# Patient Record
Sex: Female | Born: 1980 | Race: Black or African American | Hispanic: No | Marital: Single | Smoking: Never smoker
Health system: Southern US, Community
[De-identification: ages and names within clinical notes are randomized; demographics above are authoritative.]

---

## 2008-09-05 ENCOUNTER — Emergency Department (HOSPITAL_COMMUNITY): Admission: EM | Admit: 2008-09-05 | Discharge: 2008-09-05 | Payer: Self-pay | Admitting: Emergency Medicine

## 2008-09-07 ENCOUNTER — Emergency Department (HOSPITAL_COMMUNITY): Admission: EM | Admit: 2008-09-07 | Discharge: 2008-09-07 | Payer: Self-pay | Admitting: Family Medicine

## 2008-10-12 ENCOUNTER — Emergency Department (HOSPITAL_COMMUNITY): Admission: EM | Admit: 2008-10-12 | Discharge: 2008-10-12 | Payer: Self-pay | Admitting: Emergency Medicine

## 2010-03-15 ENCOUNTER — Emergency Department (HOSPITAL_BASED_OUTPATIENT_CLINIC_OR_DEPARTMENT_OTHER)
Admission: EM | Admit: 2010-03-15 | Discharge: 2010-03-15 | Payer: Self-pay | Source: Home / Self Care | Admitting: Emergency Medicine

## 2010-07-15 LAB — URINALYSIS, ROUTINE W REFLEX MICROSCOPIC
Ketones, ur: 15 mg/dL — AB
Nitrite: NEGATIVE
Protein, ur: NEGATIVE mg/dL
Urobilinogen, UA: 1 mg/dL (ref 0.0–1.0)
pH: 6 (ref 5.0–8.0)

## 2010-07-15 LAB — RPR: RPR Ser Ql: NONREACTIVE

## 2010-07-15 LAB — WET PREP, GENITAL

## 2010-07-15 LAB — PREGNANCY, URINE: Preg Test, Ur: NEGATIVE

## 2010-08-12 LAB — POCT RAPID STREP A (OFFICE): Streptococcus, Group A Screen (Direct): POSITIVE — AB

## 2011-10-18 ENCOUNTER — Encounter (HOSPITAL_BASED_OUTPATIENT_CLINIC_OR_DEPARTMENT_OTHER): Payer: Self-pay | Admitting: *Deleted

## 2011-10-18 ENCOUNTER — Emergency Department (HOSPITAL_BASED_OUTPATIENT_CLINIC_OR_DEPARTMENT_OTHER)
Admission: EM | Admit: 2011-10-18 | Discharge: 2011-10-18 | Disposition: A | Discharge status: Home / Self Care | Payer: No Typology Code available for payment source | Attending: Emergency Medicine | Admitting: Emergency Medicine

## 2011-10-18 DIAGNOSIS — R51 Headache: Secondary | ICD-10-CM | POA: Insufficient documentation

## 2011-10-18 DIAGNOSIS — M546 Pain in thoracic spine: Secondary | ICD-10-CM | POA: Insufficient documentation

## 2011-10-18 DIAGNOSIS — S0990XA Unspecified injury of head, initial encounter: Secondary | ICD-10-CM

## 2011-10-18 DIAGNOSIS — M542 Cervicalgia: Secondary | ICD-10-CM | POA: Insufficient documentation

## 2011-10-18 DIAGNOSIS — Y92410 Unspecified street and highway as the place of occurrence of the external cause: Secondary | ICD-10-CM | POA: Insufficient documentation

## 2011-10-18 DIAGNOSIS — S161XXA Strain of muscle, fascia and tendon at neck level, initial encounter: Secondary | ICD-10-CM

## 2011-10-18 MED ORDER — TRAMADOL HCL 50 MG PO TABS: 50.0000 mg | ORAL_TABLET | Freq: Four times a day (QID) | ORAL | Status: DC | PRN

## 2011-10-18 MED ORDER — METHOCARBAMOL 500 MG PO TABS: 500.0000 mg | ORAL_TABLET | Freq: Two times a day (BID) | ORAL | Status: DC

## 2011-10-18 MED ORDER — IBUPROFEN 800 MG PO TABS: 800.0000 mg | ORAL_TABLET | Freq: Three times a day (TID) | ORAL | Status: DC
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
## 2011-10-20 ENCOUNTER — Encounter (HOSPITAL_BASED_OUTPATIENT_CLINIC_OR_DEPARTMENT_OTHER): Payer: Self-pay | Admitting: *Deleted

## 2011-10-20 ENCOUNTER — Emergency Department (HOSPITAL_BASED_OUTPATIENT_CLINIC_OR_DEPARTMENT_OTHER): Payer: No Typology Code available for payment source

## 2011-10-20 ENCOUNTER — Emergency Department (HOSPITAL_BASED_OUTPATIENT_CLINIC_OR_DEPARTMENT_OTHER)
Admission: EM | Admit: 2011-10-20 | Discharge: 2011-10-20 | Disposition: A | Discharge status: Home / Self Care | Payer: No Typology Code available for payment source | Attending: Emergency Medicine | Admitting: Emergency Medicine

## 2011-10-20 ENCOUNTER — Other Ambulatory Visit: Payer: Self-pay | Admitting: Specialist

## 2011-10-20 DIAGNOSIS — R51 Headache: Secondary | ICD-10-CM

## 2011-10-20 MED ORDER — KETOROLAC TROMETHAMINE 60 MG/2ML IM SOLN
60.0000 mg | Freq: Once | INTRAMUSCULAR | Status: AC
  Administered 2011-10-20: 60 mg via INTRAMUSCULAR
  Filled 2011-10-20: qty 2

## 2011-10-20 MED ORDER — HYDROCODONE-ACETAMINOPHEN 5-500 MG PO TABS: 1.0000 | ORAL_TABLET | Freq: Four times a day (QID) | ORAL | Status: AC | PRN
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
## 2011-10-20 NOTE — ED Provider Notes (Signed)
Medical screening examination/treatment/procedure(s) were performed by non-physician practitioner and as supervising physician I was immediately available for consultation/collaboration.    Tevyn Codd L Laurajean Hosek, MD 10/20/11 0708 

## 2011-10-20 NOTE — Discharge Instructions (Signed)

## 2011-10-22 ENCOUNTER — Other Ambulatory Visit: Payer: No Typology Code available for payment source

## 2012-12-10 ENCOUNTER — Emergency Department (HOSPITAL_BASED_OUTPATIENT_CLINIC_OR_DEPARTMENT_OTHER)
Admission: EM | Admit: 2012-12-10 | Discharge: 2012-12-10 | Disposition: A | Discharge status: Home / Self Care | Payer: Medicaid Other | Attending: Emergency Medicine | Admitting: Emergency Medicine

## 2012-12-10 ENCOUNTER — Encounter (HOSPITAL_BASED_OUTPATIENT_CLINIC_OR_DEPARTMENT_OTHER): Payer: Self-pay | Admitting: Emergency Medicine

## 2012-12-10 DIAGNOSIS — R197 Diarrhea, unspecified: Secondary | ICD-10-CM | POA: Insufficient documentation

## 2012-12-10 DIAGNOSIS — N390 Urinary tract infection, site not specified: Secondary | ICD-10-CM | POA: Insufficient documentation

## 2012-12-10 DIAGNOSIS — Z3202 Encounter for pregnancy test, result negative: Secondary | ICD-10-CM | POA: Insufficient documentation

## 2012-12-10 DIAGNOSIS — R110 Nausea: Secondary | ICD-10-CM | POA: Insufficient documentation

## 2012-12-10 DIAGNOSIS — R109 Unspecified abdominal pain: Secondary | ICD-10-CM | POA: Insufficient documentation

## 2012-12-10 LAB — URINALYSIS, ROUTINE W REFLEX MICROSCOPIC
Nitrite: NEGATIVE
Specific Gravity, Urine: 1.026 (ref 1.005–1.030)
Urobilinogen, UA: 0.2 mg/dL (ref 0.0–1.0)

## 2012-12-10 LAB — URINE MICROSCOPIC-ADD ON

## 2012-12-10 LAB — PREGNANCY, URINE: Preg Test, Ur: NEGATIVE

## 2012-12-10 MED ORDER — ONDANSETRON 8 MG PO TBDP: ORAL_TABLET | ORAL | Status: DC

## 2012-12-10 MED ORDER — ONDANSETRON 4 MG PO TBDP
4.0000 mg | ORAL_TABLET | Freq: Once | ORAL | Status: AC
  Administered 2012-12-10: 4 mg via ORAL
  Filled 2012-12-10: qty 1

## 2012-12-10 MED ORDER — HYDROCODONE-ACETAMINOPHEN 5-325 MG PO TABS: 1.0000 | ORAL_TABLET | Freq: Four times a day (QID) | ORAL | Status: DC | PRN

## 2012-12-10 MED ORDER — NITROFURANTOIN MONOHYD MACRO 100 MG PO CAPS: 100.0000 mg | ORAL_CAPSULE | Freq: Two times a day (BID) | ORAL | Status: DC

## 2012-12-10 NOTE — ED Notes (Signed)
Grape juice  Given as PO challenge.

## 2012-12-10 NOTE — ED Provider Notes (Signed)
  CSN: [id number]     Arrival date & time 12/10/12  0046 History     First MD Initiated Contact with Patient 12/10/12 0203     Chief Complaint  Patient presents with  . Abdominal Pain  . Diarrhea   (Consider location/radiation/quality/duration/timing/severity/associated sxs/prior Treatment) Patient is a 32 y.o. female presenting with diarrhea. The history is provided by the patient. No language interpreter was used.  Diarrhea Quality:  Watery Severity:  Moderate Onset quality:  Sudden Timing:  Intermittent Progression:  Unchanged Relieved by:  Nothing Worsened by:  Nothing tried Ineffective treatments:  None tried Associated symptoms: no diaphoresis, no fever and no vomiting   Risk factors: suspect food intake     History reviewed. No pertinent past medical history. History reviewed. No pertinent past surgical history. No family history on file. History  Substance Use Topics  . Smoking status: Never Smoker   . Smokeless tobacco: Not on file  . Alcohol Use: No   OB History   Grav Para Term Preterm Abortions TAB SAB Ect Mult Living                 Review of Systems  Constitutional: Negative for fever and diaphoresis.  Gastrointestinal: Positive for nausea and diarrhea. Negative for vomiting.  All other systems reviewed and are negative.    Allergies  Review of patient's allergies indicates no known allergies.  Home Medications  No current outpatient prescriptions on file. BP 122/82  Pulse 90  Temp(Src) 99.2 F (37.3 C) (Oral)  Resp 16  Ht 5\' 8"  (1.727 m)  Wt 150 lb (68.04 kg)  BMI 22.81 kg/m2  SpO2 100%  LMP 12/04/2012 Physical Exam  Constitutional: She is oriented to person, place, and time. She appears well-developed and well-nourished. No distress.  HENT:  Head: Normocephalic and atraumatic.  Mouth/Throat: Oropharynx is clear and moist.  Eyes: Conjunctivae are normal. Pupils are equal, round, and reactive to light.  Neck: Normal range of motion. Neck  supple.  Cardiovascular: Normal rate, regular rhythm and intact distal pulses.   Pulmonary/Chest: Effort normal and breath sounds normal. She has no wheezes. She has no rales.  Abdominal: Soft. Bowel sounds are normal. There is no tenderness. There is no rebound and no guarding.  Musculoskeletal: Normal range of motion.  Neurological: She is alert and oriented to person, place, and time.  Skin: Skin is warm and dry.  Psychiatric: She has a normal mood and affect.    ED Course   Procedures (including critical care time)  Labs Reviewed  URINALYSIS, ROUTINE W REFLEX MICROSCOPIC - Abnormal; Notable for the following:    APPearance TURBID (*)    Leukocytes, UA MODERATE (*)    All other components within normal limits  URINE MICROSCOPIC-ADD ON - Abnormal; Notable for the following:    Squamous Epithelial / LPF FEW (*)    Bacteria, UA MANY (*)    All other components within normal limits  URINE CULTURE  PREGNANCY, URINE   No results found. No diagnosis found.  MDM  No vomiting 12 hours of diarrhea and cramping with BM.  No travel no antibiotics.  Will given anti emetics and bland diet.  Return for pain.  Intractable diarrhea or vomiting.  No indication for imaging at this time.  Will treat for UTI.    Jasmine Awe, MD 12/10/12 [id number]

## 2012-12-10 NOTE — ED Notes (Signed)
Watery diarrhea and sharp abd pain since yesterday.  Diarrhea immediately after eating or drinking. Denies vomiting.

## 2012-12-13 LAB — URINE CULTURE

## 2012-12-14 NOTE — ED Notes (Signed)
+   Urine Culture Patient treated per protocol MD.  

## 2013-04-06 ENCOUNTER — Encounter (HOSPITAL_BASED_OUTPATIENT_CLINIC_OR_DEPARTMENT_OTHER): Payer: Self-pay | Admitting: Emergency Medicine

## 2013-04-06 ENCOUNTER — Emergency Department (HOSPITAL_BASED_OUTPATIENT_CLINIC_OR_DEPARTMENT_OTHER)
Admission: EM | Admit: 2013-04-06 | Discharge: 2013-04-06 | Disposition: A | Discharge status: Home / Self Care | Payer: Medicaid Other | Attending: Emergency Medicine | Admitting: Emergency Medicine

## 2013-04-06 DIAGNOSIS — N390 Urinary tract infection, site not specified: Secondary | ICD-10-CM | POA: Insufficient documentation

## 2013-04-06 DIAGNOSIS — Z3202 Encounter for pregnancy test, result negative: Secondary | ICD-10-CM | POA: Insufficient documentation

## 2013-04-06 LAB — URINE MICROSCOPIC-ADD ON

## 2013-04-06 LAB — URINALYSIS, ROUTINE W REFLEX MICROSCOPIC
Bilirubin Urine: NEGATIVE
Ketones, ur: NEGATIVE mg/dL
Nitrite: NEGATIVE
Urobilinogen, UA: 1 mg/dL (ref 0.0–1.0)
pH: 8.5 — ABNORMAL HIGH (ref 5.0–8.0)

## 2013-04-06 MED ORDER — NITROFURANTOIN MONOHYD MACRO 100 MG PO CAPS: 100.0000 mg | ORAL_CAPSULE | Freq: Two times a day (BID) | ORAL | Status: DC

## 2013-04-06 NOTE — ED Notes (Signed)
Painful urination   Also now seeing some blood on tissue

## 2013-04-06 NOTE — ED Provider Notes (Signed)
TIME SEEN: 3:35 PM  CHIEF COMPLAINT: Hematuria, dysuria  HPI: Patient is a 32 year old G3 P2 A1 who presents emergency department with foul-smelling urine that started yesterday and dysuria, urinary frequency and hematuria that started today. She states this feels similar to her prior urinary tract infections. She's not sexually active. Her last period was 2 weeks ago. She denies any nausea, vomiting or diarrhea. No back pain. No fevers or chills. No vaginal bleeding or discharge.  ROS: See HPI Constitutional: no fever  Eyes: no drainage  ENT: no runny nose   Cardiovascular:  no chest pain  Resp: no SOB  GI: no vomiting GU: dysuria Integumentary: no rash  Allergy: no hives  Musculoskeletal: no leg swelling  Neurological: no slurred speech ROS otherwise negative  PAST MEDICAL HISTORY/PAST SURGICAL HISTORY:  History reviewed. No pertinent past medical history.  MEDICATIONS:  Prior to Admission medications   Medication Sig Start Date End Date Taking? Authorizing Provider  HYDROcodone-acetaminophen (NORCO) 5-325 MG per tablet Take 1 tablet by mouth every 6 (six) hours as needed for pain. 12/10/12   April K Palumbo-Rasch, MD  nitrofurantoin, macrocrystal-monohydrate, (MACROBID) 100 MG capsule Take 1 capsule (100 mg total) by mouth 2 (two) times daily. 12/10/12   April K Palumbo-Rasch, MD  ondansetron [facility] ODT) 8 MG disintegrating tablet 8mg  ODT q8 hours prn nausea 12/10/12   April Smitty Cords, MD    ALLERGIES:  No Known Allergies  SOCIAL HISTORY:  History  Substance Use Topics  . Smoking status: Never Smoker   . Smokeless tobacco: Not on file  . Alcohol Use: No    FAMILY HISTORY: No family history on file.  EXAM: BP 108/68  Pulse 79  Temp(Src) 98.4 F (36.9 C) (Oral)  Resp 20  Ht 5\' 8"  (1.727 m)  Wt 186 lb (84.369 kg)  BMI 28.29 kg/m2  SpO2 100% CONSTITUTIONAL: Alert and oriented and responds appropriately to questions. Well-appearing; well-nourished HEAD:  Normocephalic EYES: Conjunctivae clear, PERRL ENT: normal nose; no rhinorrhea; moist mucous membranes; pharynx without lesions noted NECK: Supple, no meningismus, no LAD  CARD: RRR; S1 and S2 appreciated; no murmurs, no clicks, no rubs, no gallops RESP: Normal chest excursion without splinting or tachypnea; breath sounds clear and equal bilaterally; no wheezes, no rhonchi, no rales,  ABD/GI: Normal bowel sounds; non-distended; soft, non-tender, no rebound, no guarding BACK:  The back appears normal and is non-tender to palpation, there is no CVA tenderness EXT: Normal ROM in all joints; non-tender to palpation; no edema; normal capillary refill; no cyanosis    SKIN: Normal color for age and race; warm NEURO: Moves all extremities equally PSYCH: The patient's mood and manner are appropriate. Grooming and personal hygiene are appropriate.  MEDICAL DECISION MAKING: Patient here with symptoms similar to her prior UTI. Urinalysis shows large hemoglobin and large leukocytes with many bacteria. Will treat for UTI. Culture pending. Pregnancy test is negative. Prior urine culture in August 2014 shows Escherichia coli that is pansensitive to all antibiotics other than ampicillin. We'll discharge home on Macrobid. Given return precautions and PCP followup.       Layla Maw Ward, DO 04/06/13 1553

## 2013-04-08 LAB — URINE CULTURE

## 2013-04-09 ENCOUNTER — Telehealth (HOSPITAL_COMMUNITY): Payer: Self-pay | Admitting: Emergency Medicine

## 2013-04-09 NOTE — ED Notes (Signed)
Post ED Visit - Positive Culture Follow-up  Culture report reviewed by antimicrobial stewardship pharmacist: []  Wes Dulaney, Pharm.D., BCPS []  Celedonio Miyamoto, Pharm.D., BCPS [x]  Georgina Pillion, Pharm.D., BCPS []  [place], [place].D., BCPS, AAHIVP []  Estella Husk, Pharm.D., BCPS, AAHIVP  Positive urine culture Treated with Macrobid, organism sensitive to the same and no further patient follow-up is required at this time.  Kylie A Holland 04/09/2013, 11:01 AM

## 2014-08-26 ENCOUNTER — Emergency Department (HOSPITAL_COMMUNITY)
Admission: EM | Admit: 2014-08-26 | Discharge: 2014-08-26 | Disposition: A | Discharge status: Home / Self Care | Payer: 59 | Attending: Emergency Medicine | Admitting: Emergency Medicine

## 2014-08-26 DIAGNOSIS — Z79899 Other long term (current) drug therapy: Secondary | ICD-10-CM | POA: Insufficient documentation

## 2014-08-26 DIAGNOSIS — Y9289 Other specified places as the place of occurrence of the external cause: Secondary | ICD-10-CM | POA: Insufficient documentation

## 2014-08-26 DIAGNOSIS — Z3202 Encounter for pregnancy test, result negative: Secondary | ICD-10-CM | POA: Insufficient documentation

## 2014-08-26 DIAGNOSIS — Y998 Other external cause status: Secondary | ICD-10-CM | POA: Insufficient documentation

## 2014-08-26 DIAGNOSIS — R300 Dysuria: Secondary | ICD-10-CM | POA: Insufficient documentation

## 2014-08-26 DIAGNOSIS — S60511A Abrasion of right hand, initial encounter: Secondary | ICD-10-CM | POA: Diagnosis not present

## 2014-08-26 DIAGNOSIS — S6991XA Unspecified injury of right wrist, hand and finger(s), initial encounter: Secondary | ICD-10-CM | POA: Diagnosis present

## 2014-08-26 DIAGNOSIS — Z23 Encounter for immunization: Secondary | ICD-10-CM | POA: Diagnosis not present

## 2014-08-26 DIAGNOSIS — Y9389 Activity, other specified: Secondary | ICD-10-CM | POA: Diagnosis not present

## 2014-08-26 DIAGNOSIS — W458XXA Other foreign body or object entering through skin, initial encounter: Secondary | ICD-10-CM | POA: Insufficient documentation

## 2014-08-26 LAB — URINALYSIS, ROUTINE W REFLEX MICROSCOPIC
Bilirubin Urine: NEGATIVE
GLUCOSE, UA: NEGATIVE mg/dL
HGB URINE DIPSTICK: NEGATIVE
KETONES UR: NEGATIVE mg/dL
Nitrite: NEGATIVE
Protein, ur: NEGATIVE mg/dL
SPECIFIC GRAVITY, URINE: 1.024 (ref 1.005–1.030)
UROBILINOGEN UA: 0.2 mg/dL (ref 0.0–1.0)
pH: 6 (ref 5.0–8.0)

## 2014-08-26 LAB — URINE MICROSCOPIC-ADD ON

## 2014-08-26 LAB — POC URINE PREG, ED: Preg Test, Ur: NEGATIVE

## 2014-08-26 MED ORDER — TETANUS-DIPHTH-ACELL PERTUSSIS 5-2.5-18.5 LF-MCG/0.5 IM SUSP
0.5000 mL | Freq: Once | INTRAMUSCULAR | Status: AC
  Administered 2014-08-26: 0.5 mL via INTRAMUSCULAR
  Filled 2014-08-26: qty 0.5

## 2014-08-26 NOTE — Discharge Instructions (Signed)
Abrasion An abrasion is a cut or scrape of the skin. Abrasions do not extend through all layers of the skin and most heal within 10 days. It is important to care for your abrasion properly to prevent infection. CAUSES  Most abrasions are caused by falling on, or gliding across, the ground or other surface. When your skin rubs on something, the outer and inner layer of skin rubs off, causing an abrasion. DIAGNOSIS  Your caregiver will be able to diagnose an abrasion during a physical exam.  TREATMENT  Your treatment depends on how large and deep the abrasion is. Generally, your abrasion will be cleaned with water and a mild soap to remove any dirt or debris. An antibiotic ointment may be put over the abrasion to prevent an infection. A bandage (dressing) may be wrapped around the abrasion to keep it from getting dirty.  You may need a tetanus shot if:  You cannot remember when you had your last tetanus shot.  You have never had a tetanus shot.  The injury broke your skin. If you get a tetanus shot, your arm may swell, get red, and feel warm to the touch. This is common and not a problem. If you need a tetanus shot and you choose not to have one, there is a rare chance of getting tetanus. Sickness from tetanus can be serious.  HOME CARE INSTRUCTIONS   If a dressing was applied, change it at least once a day or as directed by your caregiver. If the bandage sticks, soak it off with warm water.   Wash the area with water and a mild soap to remove all the ointment 2 times a day. Rinse off the soap and pat the area dry with a clean towel.   Reapply any ointment as directed by your caregiver. This will help prevent infection and keep the bandage from sticking. Use gauze over the wound and under the dressing to help keep the bandage from sticking.   Change your dressing right away if it becomes wet or dirty.   Only take over-the-counter or prescription medicines for pain, discomfort, or fever as  directed by your caregiver.   Follow up with your caregiver within 24-48 hours for a wound check, or as directed. If you were not given a wound-check appointment, look closely at your abrasion for redness, swelling, or pus. These are signs of infection. SEEK IMMEDIATE MEDICAL CARE IF:   You have increasing pain in the wound.   You have redness, swelling, or tenderness around the wound.   You have pus coming from the wound.   You have a fever or persistent symptoms for more than 2-3 days.  You have a fever and your symptoms suddenly get worse.  You have a bad smell coming from the wound or dressing.  MAKE SURE YOU:   Understand these instructions.  Will watch your condition.  Will get help right away if you are not doing well or get worse. Document Released: 01/28/2005 Document Revised: 04/06/2012 Document Reviewed: 03/24/2011 [facility] Patient Information 2015 [place], [place]. This information is not intended to replace advice given to you by your health care provider. Make sure you discuss any questions you have with your health care provider.    Diphtheria Toxoid; Tetanus Toxoid Adsorbed, DT, Td What is this medicine? DIPHTHERIA AND TETANUS TOXOIDS ADSORBED (dif THEER ee uh and TET n [place] TOK soids ad SAWRB) is a vaccine. It is used to prevent infections of diphtheria and tetanus (lockjaw). This  medicine may be used for other purposes; ask your health care provider or pharmacist if you have questions. COMMON BRAND NAME(S): DECAVAC, TENIVAC What should I tell my health care provider before I take this medicine? They need to know if you have any of these conditions: -bleeding disorder -immune system problems -infection with fever -low levels of platelets in the blood -an unusual or allergic reaction to diphtheria or tetanus toxoid, latex, thimerosal, other medicines, foods, dyes, or preservatives -pregnant or trying to get pregnant -breast-feeding How should I use this  medicine? This vaccine is for injection into a muscle. It is given by a health care professional. A copy of Vaccine Information Statements will be given before each vaccination. Read this sheet carefully each time. The sheet may change frequently. Talk to your pediatrician regarding the use of this medicine in children. While this drug may be prescribed for selected conditions, precautions do apply. Overdosage: If you think you have taken too much of this medicine contact a poison control center or emergency room at once. NOTE: This medicine is only for you. Do not share this medicine with others. What if I miss a dose? Keep appointments for follow-up (booster) doses as directed. It is important not to miss your dose. Call your doctor or health care professional if you are unable to keep an appointment. What may interact with this medicine? -adalimumab -anakinra -infliximab -live vaccines -medicines that suppress your immune system -medicines to treat cancer -medicines that treat or prevent blood clots like daily aspirin, enoxaparin, heparin, ticlopidine, warfarin -radiopharmaceuticals like iodine I-125 or I-131 This list may not describe all possible interactions. Give your health care provider a list of all the medicines, herbs, non-prescription drugs, or dietary supplements you use. Also tell them if you smoke, drink alcohol, or use illegal drugs. Some items may interact with your medicine. What should I watch for while using this medicine? Contact your doctor or health care professional and seek emergency medical care if any serious side effects occur. This vaccine, like all vaccines, may not fully protect everyone. What side effects may I notice from receiving this medicine? Side effects that you should report to your doctor or health care professional as soon as possible: -allergic reactions like skin rash, itching or hives, swelling of the face, lips, or tongue -arthritis  pain -breathing problems -changes in hearing -extreme changes in behavior -fast, irregular heartbeat -fever over 100 degrees F -pain, tingling, numbness in the hands or feet -seizures -unusually weak or tired Side effects that usually do not require medical attention (report to your doctor or health care professional if they continue or are bothersome): -aches or pains -bruising, pain, swelling at site where injected -headache -loss of appetite -low-grade fever of 100 degrees F or less -nausea, vomiting -sleepy -swollen glands This list may not describe all possible side effects. Call your doctor for medical advice about side effects. You may report side effects to FDA at 1-800-FDA-1088. Where should I keep my medicine? This drug is given in a hospital or clinic and will not be stored at home. NOTE: This sheet is a summary. It may not cover all possible information. If you have questions about this medicine, talk to your doctor, pharmacist, or health care provider.  2015, Elsevier/Gold Standard. (2007-08-18 13:57:36)

## 2014-08-26 NOTE — ED Notes (Signed)
Pt presents today for a tetanus shot. Pt reports she nicked her hand on a rusty  nail  On Sat

## 2014-08-26 NOTE — ED Notes (Signed)
Declined W/C at D/C and was escorted to lobby by RN. 

## 2014-08-26 NOTE — ED Provider Notes (Signed)
CSN: [id number]     Arrival date & time 08/26/14  4098 History   First MD Initiated Contact with Patient 08/26/14 [phone]     Chief Complaint  Patient presents with  . Hand Injury     (Consider location/radiation/quality/duration/timing/severity/associated sxs/prior Treatment) HPI  34 year old female presents after injuring her hand on a rusty nail yesterday. The patient was trying to repair an old gait and someone was nailing it back on and it scratched her right hand. The patient irrigated the wound with soap and water. Her last tetanus shot was 8-10 years ago and so she presents for another. She denies a weakness or numbness. No recurrent bleeding. No swelling at the site. At the end of the history she then asks for a new medicine for a UTI. She's been on doxycycline for the past 5 days. She saw her PCP and had a urinalysis checked that showed UTI. She was never complaining of symptoms at that time. However over the last 2 days after taking the medicine she is nauseated and has some dysuria. No fevers or chills. No abdominal pain or vomiting.  No past medical history on file. No past surgical history on file. No family history on file. History  Substance Use Topics  . Smoking status: Never Smoker   . Smokeless tobacco: Not on file  . Alcohol Use: No   OB History    No data available     Review of Systems  Constitutional: Negative for fever.  Gastrointestinal: Positive for nausea. Negative for vomiting and abdominal pain.  Genitourinary: Positive for dysuria. Negative for menstrual problem.  Musculoskeletal: Negative for joint swelling.  Skin: Positive for wound.  All other systems reviewed and are negative.     Allergies  Review of patient's allergies indicates no known allergies.  Home Medications   Prior to Admission medications   Medication Sig Start Date End Date Taking? Authorizing Provider  nitrofurantoin, macrocrystal-monohydrate, (MACROBID) 100 MG capsule Take 1  capsule (100 mg total) by mouth 2 (two) times daily. 04/06/13   Kristen N Ward, DO   BP 126/84 mmHg  Pulse 63  Temp(Src) 98.2 F (36.8 C) (Oral)  Resp 16  Ht  (1.727 m)  Wt 177 lb (80.287 kg)  BMI 26.92 kg/m2  SpO2 100%  LMP 08/01/2014 Physical Exam  Constitutional: She is oriented to person, place, and time. She appears well-developed and well-nourished.  HENT:  Head: Normocephalic and atraumatic.  Right Ear: External ear normal.  Left Ear: External ear normal.  Nose: Nose normal.  Eyes: Right eye exhibits no discharge. Left eye exhibits no discharge.  Neck: Neck supple.  Cardiovascular: Normal rate, regular rhythm and normal heart sounds.   Pulmonary/Chest: Effort normal and breath sounds normal.  Abdominal: Soft. She exhibits no distension. There is no tenderness.  Musculoskeletal:       Right hand: She exhibits laceration.       Hands: Neurological: She is alert and oriented to person, place, and time.  Skin: Skin is warm and dry.  Nursing note and vitals reviewed.   ED Course  Procedures (including critical care time) Labs Review Labs Reviewed  URINALYSIS, ROUTINE W REFLEX MICROSCOPIC - Abnormal; Notable for the following:    Leukocytes, UA SMALL (*)    All other components within normal limits  URINE MICROSCOPIC-ADD ON - Abnormal; Notable for the following:    Squamous Epithelial / LPF MANY (*)    All other components within normal limits  POC URINE PREG, ED  Imaging Review No results found.   EKG Interpretation None      MDM   Final diagnoses:  Hand abrasion, right, initial encounter    Patient with a small abrasion to right hand from a rusty nail yesterday. No gross signs of infection. We'll update tetanus. Her other symptoms are odd and probably related to taking the doxycycline. It seems that she prior had asymptomatic bacteriuria at her PCPs office. Will recheck urine and if positive for a UTI we will treat with Macrobid otherwise will  advise stopping the doxycycline.    Pricilla LovelessScott Kemonte Ullman, MD 08/26/14 1031

## 2015-04-27 ENCOUNTER — Emergency Department (INDEPENDENT_AMBULATORY_CARE_PROVIDER_SITE_OTHER)
Admission: EM | Admit: 2015-04-27 | Discharge: 2015-04-27 | Disposition: A | Discharge status: Home / Self Care | Payer: Self-pay | Source: Home / Self Care | Attending: Emergency Medicine | Admitting: Emergency Medicine

## 2015-04-27 ENCOUNTER — Encounter (HOSPITAL_COMMUNITY): Payer: Self-pay

## 2015-04-27 DIAGNOSIS — F419 Anxiety disorder, unspecified: Secondary | ICD-10-CM

## 2015-04-27 MED ORDER — HYDROXYZINE HCL 25 MG PO TABS: ORAL_TABLET | ORAL | Status: DC

## 2015-04-27 MED ORDER — DIAZEPAM 5 MG PO TABS: 5.0000 mg | ORAL_TABLET | Freq: Three times a day (TID) | ORAL | Status: DC | PRN

## 2015-04-27 NOTE — Discharge Instructions (Signed)
Follow up with a behavioral health specialist of your choice. Counseling may be helpful while you are going through this. [facility] if you start having feelings of wanting to hurt yourself or other people, if you start hearing or seeing things that other people are not seeing or hearing, if things become too much, or other concerns.

## 2015-04-27 NOTE — ED Notes (Signed)
Pt here for anxiety. Pt daughter has been missing since Thursday. Pt alert and oriented.

## 2015-04-27 NOTE — ED Provider Notes (Signed)
HPI  SUBJECTIVE:  Deborah Grant is a 34 y.o. female who presents with "panicky moments", crying spells, not sleeping well, decreased appetite. Past 3 days after her daughter went missing. Symptoms are better with keeping herself busy, worse when she is thinking about it. She has not tried anything for this. She denies chest pain, shortness of breath, palpitations. She states that her stomach feels like it is "churning" but denies any abdominal pain. No nausea, vomiting. She is otherwise healthy. No auditory or visual hallucinations no suicidal or homicidal ideations. She has no psychiatric history most specifically of depression, anxiety. No history of hypothyroidism. No history of diabetes, hypertension.    History reviewed. No pertinent past medical history.  History reviewed. No pertinent past surgical history.  History reviewed. No pertinent family history.  Social History  Substance Use Topics  . Smoking status: Never Smoker   . Smokeless tobacco: None  . Alcohol Use: No    No current facility-administered medications for this encounter.  Current outpatient prescriptions:  .  diazepam (VALIUM) 5 MG tablet, Take 1 tablet (5 mg total) by mouth every 8 (eight) hours as needed for anxiety., Disp: 16 tablet, Rfl: 0 .  hydrOXYzine (ATARAX/VISTARIL) 25 MG tablet, Take 1-2 tablets every 6 hours as needed for anxiety, Disp: 30 tablet, Rfl: 1 .  nitrofurantoin, macrocrystal-monohydrate, (MACROBID) 100 MG capsule, Take 1 capsule (100 mg total) by mouth 2 (two) times daily., Disp: 10 capsule, Rfl: 0  No Known Allergies   ROS  As noted in HPI.   Physical Exam  BP 124/82 mmHg  Pulse 65  Temp(Src) 98.6 F (37 C) (Oral)  Resp 16  SpO2 100%  Constitutional: Well developed, well nourished, appears anxious Eyes:  EOMI, conjunctiva normal bilaterally HENT: Normocephalic, atraumatic,mucus membranes moist Respiratory: Normal inspiratory effort lungs clear bilaterally Cardiovascular:   regular rate and rhythm no murmurs rubs gallops GI: nondistended skin: No rash, skin intact Musculoskeletal: no deformities Neurologic: Alert & oriented x 3, no focal neuro deficits Psychiatric: Speech and behavior appropriate, no apparent auditory or visual hallucinations, no tangential thinking. Patient is tearful.   ED Course   Medications - No data to display  No orders of the defined types were placed in this encounter.    No results found for this or any previous visit (from the past 24 hour(s)). No results found.  ED Clinical Impression  Anxiety   ED Assessment/Plan [facility]  [facility]otic [facility] queried. No narcotic prescriptions in the past 6 months  Patient has appropriate situational anxiety. She has no suicidal or homicidal ideations and auditory or visual hallucinations. She seems otherwise stable. We will send home with Vistaril for her to try, short course of Valium if the Vistaril doesn't work and referral to behavior health. Believe that the patient would benefit from counseling.   Discussed  MDM, plan and followup with patient. Discussed sn/sx that should prompt return to the UC or ED. Patient agrees with plan.   *This clinic note was created using Dragon dictation software. Therefore, there may be occasional mistakes despite careful proofreading.  ?   Domenick GongAshley Jontavia Leatherbury, MD 04/27/15 [phone]

## 2015-09-01 ENCOUNTER — Ambulatory Visit (HOSPITAL_COMMUNITY)
Admission: EM | Admit: 2015-09-01 | Discharge: 2015-09-01 | Disposition: A | Discharge status: Home / Self Care | Payer: Self-pay | Attending: Emergency Medicine | Admitting: Emergency Medicine

## 2015-09-01 ENCOUNTER — Ambulatory Visit (INDEPENDENT_AMBULATORY_CARE_PROVIDER_SITE_OTHER): Payer: Self-pay

## 2015-09-01 ENCOUNTER — Encounter (HOSPITAL_COMMUNITY): Payer: Self-pay | Admitting: *Deleted

## 2015-09-01 DIAGNOSIS — R51 Headache: Secondary | ICD-10-CM

## 2015-09-01 DIAGNOSIS — R079 Chest pain, unspecified: Secondary | ICD-10-CM

## 2015-09-01 DIAGNOSIS — R519 Headache, unspecified: Secondary | ICD-10-CM

## 2015-09-01 MED ORDER — IBUPROFEN 800 MG PO TABS: 800.0000 mg | ORAL_TABLET | Freq: Three times a day (TID) | ORAL | Status: DC

## 2015-09-01 NOTE — Discharge Instructions (Signed)
Take the ibuprofen on a regular basis. Also try some Pepcid 40 mg twice a day. This may be partially due to acid reflux.  Follow-up with a primary care physician of your choice. Try any of the practices below. Go Immediately to the ER if your chest heaviness while walking returns, if you start having cold sweats and nausea with the chest pain, if the chest pain Starts lasting for longer periods of time, if it is associated with exertion, or for other concerns.  [facility]: [address] [place] [id number]  [phone]  [facility]: [address] [place] [id number]   [phone]  [facility]: [address] [place] [id number]  [phone]  [facility] : [address] [place] [id number] [phone]  [facility]: [address] [place] [phone]  [facility]: [address] [place] [id number] [phone]  Dr. Oneal GroutMahima Pandey [address] [facility] [address] [facility] [place] [id number]  [phone]  Dr. Jackie PlumGeorge Osei-Bonsu, [facility]. [address] [place] [address]  [phone]

## 2015-09-01 NOTE — ED Notes (Signed)
Started with intermittent substernal CP 5 days ago that is becoming more constant.  Started approx 4 days ago with HA.  Started 2-3 days ago with intermittent SOB, slight dizziness and "feeling cloudy in my head", and hot flashes.  Denies fevers or congestion.  Has tried IBU without any relief.  Denies any SOB @ present.

## 2015-09-01 NOTE — ED Provider Notes (Signed)
HPI  SUBJECTIVE:  Deborah Grant is a 35 y.o. female who presents with Multiple complaints. First, she reports 5 days of intermittent, sharp, substernal, nonradiating chest pain that lasts seconds. There are no aggravating or alleviating factors. She has tried ibuprofen 400 mg once a day for this. She denies nausea, diaphoresis, palpitations, presyncope, syncope. No shortness of breath with the chest pain. No radiation up her neck, down her arm, or through to her back. There is no exertional or positional component. She denies water brash, belching, burning chest pain. No abdominal pain. No recent URI. No trauma to the chest. No change in physical activity. She has never had symptoms like this before. No coughing, wheezing, increased work of breathing, lower extremity edema, leg pain, hemoptysis, OCP, surgery in the past 4 weeks. Family history negative for MI.  Second, she reports intermittent, sharp, migratory headaches during this time. It is not associated with the chest pain. She denies nasal congestion, sinus pain or pressure, dysarthria, arm or leg weakness, visual changes, double vision. The headaches are not associated with exertion.She states that these are different from previous headaches in that they are sharp, but they're not the worst headaches of her life.  Third, she reports an episode of chest heaviness while walking outside in the heat today. It lasts several minutes and then resolved with rest. She states that she felt lightheaded while having the symptoms, but she denies vertigo, dizziness, diaphoresis, nausea. She is not had any further episodes of this. Past medical history negative for cancer, DVT, PE, GERD, hypercholesterolemia, smoking, asthma, emphysema, COPD, diabetes, hypertension, CHF.: LMP 2 weeks ago, denies the possibility of being pregnant. PMD: None.    History reviewed. No pertinent past medical history.  History reviewed. No pertinent past surgical history.  No  family history on file.  Social History  Substance Use Topics  . Smoking status: Never Smoker   . Smokeless tobacco: None  . Alcohol Use: No    No current facility-administered medications for this encounter.  Current outpatient prescriptions:  .  ibuprofen (ADVIL,MOTRIN) 800 MG tablet, Take 1 tablet (800 mg total) by mouth 3 (three) times daily., Disp: 30 tablet, Rfl: 0  No Known Allergies   ROS  As noted in HPI.   Physical Exam  BP 133/86 mmHg  Pulse 78  Temp(Src) 98 F (36.7 C) (Oral)  Resp 18  SpO2 100%  LMP 08/18/2015 (Approximate)  Constitutional: Well developed, well nourished, no acute distress Eyes: PERRL, EOMI, conjunctiva normal bilaterally HENT: Normocephalic, atraumatic,mucus membranes moist. TMs normal. No nasal congestion. No sinus tenderness. Oropharynx normal. Neck: No trapezius tenderness, muscle spasm. No C-spine tenderness. Respiratory: Clear to auscultation bilaterally, no rales, no wheezing, no rhonchi. No chest wall tenderness. Cardiovascular: Normal rate and rhythm, no murmurs, no gallops, no rubs GI: Soft, nondistended, normal bowel sounds, nontender, no rebound, no guarding Back: no CVAT skin: No rash, skin intact Musculoskeletal: Calves symmetric, nontender, No edema, no tenderness, no deformities Neurologic: Alert & oriented x 3, CN II-XII intact, no motor deficits, sensation grossly intact, Finger-nose, heel shin within normal limits. Romberg negative. Tandem gait steady. Psychiatric: Speech and behavior appropriate   ED Course   Medications - No data to display  Orders Placed This Encounter  Procedures  . DG Chest 2 View    Standing Status: Standing     Number of Occurrences: 1     Standing Expiration Date:     Order Specific Question:  Reason for Exam (SYMPTOM  OR DIAGNOSIS  REQUIRED)    Answer:  chest pain  . ED EKG    Standing Status: Standing     Number of Occurrences: 1     Standing Expiration Date:     Order Specific  Question:  Reason for Exam    Answer:  Chest Pain   No results found for this or any previous visit (from the past 24 hour(s)). Dg Chest 2 View  09/01/2015  CLINICAL DATA:  Sharp chest pain for 5 days. EXAM: CHEST  2 VIEW COMPARISON:  None. FINDINGS: The lungs are clear. The pulmonary vasculature is normal. Heart size is normal. Hilar and mediastinal contours are unremarkable. There is no pleural effusion. IMPRESSION: No active cardiopulmonary disease. Electronically Signed   By: Ellery Plunk M.D.   On: 09/01/2015 20:28    ED Clinical Impression  Chest pain, unspecified chest pain type  Acute nonintractable headache, unspecified headache type   ED Assessment/Plan   EKG: Normal sinus rhythm, rate 77. Normal axis, normal intervals No hypertrophy. Isolated Q in 3. No previous EKG for comparison.  Doubt PE. Patient meets PERC rule.   Reviewed imaging independently. Normal CXR See radiology report for full details.  No evidence of cardiovascular cause of her symptoms at this time.  Could be Musculoskeletal, or GERD. We'll have her Start ibuprofen 800 mg 3 times a day, and start some Pepcid.   No evidence of stroke, ICH or other emergent cause of her headache. The episode of chest heaviness while walking today is somewhat concerning, however, discussed possibility of transferring down to the ED for evaluation of this tonight, but she has elected to wait and see if it recurs. Will go to the emergency room if it does.  Will provide primary care referral.   Discussed EKG, imaging, MDM, plan and followup with patient Gave patient very strict chest pain return precautions. She agrees with plan.  *This clinic note was created using Dragon dictation software. Therefore, there may be occasional mistakes despite careful proofreading.  ?  Domenick Gong, MD 09/01/15 2134

## 2015-09-29 ENCOUNTER — Emergency Department (HOSPITAL_BASED_OUTPATIENT_CLINIC_OR_DEPARTMENT_OTHER): Payer: No Typology Code available for payment source

## 2015-09-29 ENCOUNTER — Encounter (HOSPITAL_BASED_OUTPATIENT_CLINIC_OR_DEPARTMENT_OTHER): Payer: Self-pay | Admitting: Emergency Medicine

## 2015-09-29 ENCOUNTER — Emergency Department (HOSPITAL_BASED_OUTPATIENT_CLINIC_OR_DEPARTMENT_OTHER)
Admission: EM | Admit: 2015-09-29 | Discharge: 2015-09-29 | Disposition: A | Discharge status: Home / Self Care | Payer: No Typology Code available for payment source | Attending: Emergency Medicine | Admitting: Emergency Medicine

## 2015-09-29 DIAGNOSIS — M549 Dorsalgia, unspecified: Secondary | ICD-10-CM | POA: Insufficient documentation

## 2015-09-29 DIAGNOSIS — Y999 Unspecified external cause status: Secondary | ICD-10-CM | POA: Insufficient documentation

## 2015-09-29 DIAGNOSIS — Y92410 Unspecified street and highway as the place of occurrence of the external cause: Secondary | ICD-10-CM | POA: Insufficient documentation

## 2015-09-29 DIAGNOSIS — S199XXA Unspecified injury of neck, initial encounter: Secondary | ICD-10-CM | POA: Diagnosis present

## 2015-09-29 DIAGNOSIS — S161XXA Strain of muscle, fascia and tendon at neck level, initial encounter: Secondary | ICD-10-CM | POA: Diagnosis not present

## 2015-09-29 DIAGNOSIS — Y9389 Activity, other specified: Secondary | ICD-10-CM | POA: Insufficient documentation
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
## 2016-01-07 ENCOUNTER — Ambulatory Visit (HOSPITAL_COMMUNITY)
Admission: EM | Admit: 2016-01-07 | Discharge: 2016-01-07 | Disposition: A | Discharge status: Home / Self Care | Payer: Self-pay | Attending: Family Medicine | Admitting: Family Medicine

## 2016-01-07 ENCOUNTER — Encounter (HOSPITAL_COMMUNITY): Payer: Self-pay | Admitting: Emergency Medicine

## 2016-01-07 DIAGNOSIS — R51 Headache: Secondary | ICD-10-CM | POA: Insufficient documentation

## 2016-01-07 DIAGNOSIS — R410 Disorientation, unspecified: Secondary | ICD-10-CM

## 2016-01-07 DIAGNOSIS — R42 Dizziness and giddiness: Secondary | ICD-10-CM

## 2016-01-07 DIAGNOSIS — R5383 Other fatigue: Secondary | ICD-10-CM

## 2016-01-07 DIAGNOSIS — M546 Pain in thoracic spine: Secondary | ICD-10-CM

## 2016-01-07 DIAGNOSIS — R519 Headache, unspecified: Secondary | ICD-10-CM

## 2016-01-07 LAB — POCT URINALYSIS DIP (DEVICE)
Bilirubin Urine: NEGATIVE
GLUCOSE, UA: NEGATIVE mg/dL
KETONES UR: NEGATIVE mg/dL
Nitrite: NEGATIVE
PROTEIN: NEGATIVE mg/dL
UROBILINOGEN UA: 0.2 mg/dL (ref 0.0–1.0)
pH: 5.5 (ref 5.0–8.0)

## 2016-01-07 LAB — POCT I-STAT, CHEM 8
CALCIUM ION: 1.25 mmol/L (ref 1.15–1.40)
CHLORIDE: 102 mmol/L (ref 101–111)
CREATININE: 0.9 mg/dL (ref 0.44–1.00)
Glucose, Bld: 99 mg/dL (ref 65–99)
HCT: 39 % (ref 36.0–46.0)
Hemoglobin: 13.3 g/dL (ref 12.0–15.0)
Potassium: 3.9 mmol/L (ref 3.5–5.1)
SODIUM: 139 mmol/L (ref 135–145)
TCO2: 27 mmol/L (ref 0–100)

## 2016-01-07 LAB — POCT PREGNANCY, URINE: Preg Test, Ur: NEGATIVE

## 2016-01-07 MED ORDER — MECLIZINE HCL 25 MG PO TABS: ORAL_TABLET | ORAL | 0 refills | Status: DC

## 2016-01-07 NOTE — ED Provider Notes (Signed)
CSN: [id number]     Arrival date & time 01/07/16  1746 History   First MD Initiated Contact with Patient 01/07/16 1831     Chief Complaint  Patient presents with  . Dizziness   (Consider location/radiation/quality/duration/timing/severity/associated sxs/prior Treatment) 35 year old female presents with several complaints. First complaint is that of being dizzy for the past month. She states is primarily constant and exacerbated with turning of the head, head movements or body position changes. Saw also complaining of shortness of breath for one week. She describes shortness of breath is having to stop talking to take a deep breath although she does not have shortness of breath and walking down the hall or exerting herself. She also states she has had periods of confusion for the past 2-3 weeks. Sometimes she will forget what she supposed to be doing or what the last 6 months it was. Complains of fatigue for 1 month, balance problems for 3 weeks and headache in between the eyes and forehead for 2 days. Denies any known trauma or injury.      History reviewed. No pertinent past medical history. History reviewed. No pertinent surgical history. History reviewed. No pertinent family history. Social History  Substance Use Topics  . Smoking status: Never Smoker  . Smokeless tobacco: Not on file  . Alcohol use No   OB History    No data available     Review of Systems  Constitutional: Positive for activity change and fatigue. Negative for fever.  Eyes: Positive for visual disturbance. Negative for photophobia and redness.       Occasional blurriness of vision not much more specific description. 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  Respiratory: Positive for shortness of breath. Negative for chest tightness and wheezing.   Cardiovascular: Negative for chest pain and leg swelling.  Gastrointestinal: Negative.   Endocrine: Negative for polyuria.  Genitourinary:  Negative for difficulty urinating, dysuria, menstrual problem, urgency, vaginal bleeding and vaginal discharge.  Musculoskeletal: Positive for back pain. Negative for joint swelling, neck pain and neck stiffness.       Soreness to the bilateral lower back. States that she sits in a chair at a computer for several hours during the day. No known injury.  Skin: Negative.   Neurological: Positive for dizziness, speech difficulty and headaches. Negative for tremors, seizures, syncope and numbness.  Psychiatric/Behavioral: Positive for confusion. The patient is not nervous/anxious.   All other systems reviewed and are negative.   Allergies  Review of patient's allergies indicates no known allergies.  Home Medications   Prior to Admission medications   Medication Sig Start Date End Date Taking? Authorizing Provider  ibuprofen (ADVIL,MOTRIN) 800 MG tablet Take 1 tablet (800 mg total) by mouth 3 (three) times daily. 09/01/15   Domenick GongAshley Mortenson, MD  meclizine (ANTIVERT) 25 MG tablet Take one half to one tablet every 8 hours as needed for dizziness. May cause drowsiness. 01/07/16   Hayden Rasmussenavid Matt Delpizzo, NP   Meds Ordered and Administered this Visit  Medications - No data to display  BP 138/87 (BP Location: Left Arm)   Pulse 67   Temp 98.2 F (36.8 C) (Oral)   Resp 18   LMP 12/30/2015 (Exact Date)   SpO2 100%  No data found.   Physical Exam  Constitutional: She is oriented to person, place, and time. She appears well-developed and well-nourished. No distress.  HENT:  Head: Normocephalic and atraumatic.  Right Ear: External ear normal.  Left  Ear: External ear normal.  Nose: Nose normal.  Mouth/Throat: Oropharynx is clear and moist.  Bilateral TMs are normal. Soft palate and tongue midline. Soft palate rises symmetrically. Swallowing reflex normal.  Eyes: Conjunctivae and EOM are normal. Pupils are equal, round, and reactive to light. Right eye exhibits no discharge. Left eye exhibits no discharge. No  scleral icterus.  Neck: Normal range of motion. Neck supple. No thyromegaly present.  Cardiovascular: Normal rate, regular rhythm, normal heart sounds and intact distal pulses.  Exam reveals no gallop.   1/6 systolic ejection murmur.  Pulmonary/Chest: Effort normal and breath sounds normal. No respiratory distress. She has no wheezes. She has no rales.  Abdominal: Soft. There is no tenderness.  Musculoskeletal: Normal range of motion. She exhibits no edema or deformity.  Lymphadenopathy:    She has no cervical adenopathy.  Neurological: She is alert and oriented to person, place, and time. She has normal strength. She displays no atrophy and no tremor. No cranial nerve deficit or sensory deficit. She exhibits normal muscle tone. She displays a negative Romberg sign. Coordination and gait normal. GCS eye subscore is 4. GCS verbal subscore is 5. GCS motor subscore is 6.  Reflex Scores:      Patellar reflexes are 2+ on the right side and 2+ on the left side. Finger to nose intact. Heel to toe balanced. Having patient perform EOMs produces some dizziness. No nystagmus. Having patient turn head, apply supine turn his left and right and repeated does not produce nystagmus. It does produce mild dizziness.  Nursing note and vitals reviewed.   Urgent Care Course   Clinical Course    Procedures (including critical care time)  Labs Review Labs Reviewed  POCT I-STAT, CHEM 8 - Abnormal; Notable for the following:       Result Value   BUN <3 (*)    All other components within normal limits  POCT URINALYSIS DIP (DEVICE) - Abnormal; Notable for the following:    Hgb urine dipstick TRACE (*)    Leukocytes, UA MODERATE (*)    All other components within normal limits  URINE CULTURE  POCT PREGNANCY, URINE   Results for orders placed or performed during the hospital encounter of 01/07/16  I-STAT, chem 8  Result Value Ref Range   Sodium 139 135 - 145 mmol/L   Potassium 3.9 3.5 - 5.1 mmol/L    Chloride 102 101 - 111 mmol/L   BUN <3 (L) 6 - 20 mg/dL   Creatinine, Ser 4.40 0.44 - 1.00 mg/dL   Glucose, Bld 99 65 - 99 mg/dL   Calcium, Ion 3.47 4.25 - 1.40 mmol/L   TCO2 27 0 - 100 mmol/L   Hemoglobin 13.3 12.0 - 15.0 g/dL   HCT 95.6 38.7 - 56.4 %  Pregnancy, urine POC  Result Value Ref Range   Preg Test, Ur NEGATIVE NEGATIVE  POCT urinalysis dip (device)  Result Value Ref Range   Glucose, UA NEGATIVE NEGATIVE mg/dL   Bilirubin Urine NEGATIVE NEGATIVE   Ketones, ur NEGATIVE NEGATIVE mg/dL   Specific Gravity, Urine <=1.005 1.005 - 1.030   Hgb urine dipstick TRACE (A) NEGATIVE   pH 5.5 5.0 - 8.0   Protein, ur NEGATIVE NEGATIVE mg/dL   Urobilinogen, UA 0.2 0.0 - 1.0 mg/dL   Nitrite NEGATIVE NEGATIVE   Leukocytes, UA MODERATE (A) NEGATIVE     Imaging Review No results found.   Visual Acuity Review  Right Eye Distance:   Left Eye Distance:   Bilateral  Distance:    Right Eye Near:   Left Eye Near:    Bilateral Near:         MDM   1. Dizziness   2. Confusion   3. Other fatigue   4. Acute nonintractable headache, unspecified headache type   5. Bilateral thoracic back pain    Drink plenty of fluids and stay well-hydrated. A urine culture is obtained been obtained. If anything is growing in the urine we will call you and treated over the phone. Take the meclizine for dizziness. This may cause drowsiness. Perform stretching maneuvers for your back muscles. Prolonged sitting and staring and a screening can cause back pain and even blurriness or blurred vision and headache. Follow-up with your primary care doctor soon for a more complete evaluation. For worsening, new symptoms or problems may need to go to the emergency department. For now your lab work is essentially normal. There are some white blood cells in your urine which is the reason we pain the culture. Read your instructions regarding specific complaints. Read the warning signs for when to note to seek medical  attention promptly. Meds ordered this encounter  Medications  . meclizine (ANTIVERT) 25 MG tablet    Sig: Take one half to one tablet every 8 hours as needed for dizziness. May cause drowsiness.    Dispense:  20 tablet    Refill:  0    Order Specific Question:   Supervising Provider    Answer:   Linna Hoff [[id number]]       Hayden Rasmussen, NP 01/07/16 1931

## 2016-01-07 NOTE — ED Triage Notes (Signed)
[facility] a [facility] dizziness that started 1 month ago and has gotten worse in the last 2 weeks. The patient stated that the dizziness changes when she changes position.

## 2016-01-07 NOTE — Discharge Instructions (Signed)
Drink plenty of fluids and stay well-hydrated. A urine culture is obtained been obtained. If anything is growing in the urine we will call you and treated over the phone. Take the meclizine for dizziness. This may cause drowsiness. Perform stretching maneuvers for your back muscles. Prolonged sitting and staring and a screening can cause back pain and even blurriness or blurred vision and headache. Follow-up with your primary care doctor soon for a more complete evaluation. For worsening, new symptoms or problems may need to go to the emergency department. For now your lab work is essentially normal. There are some white blood cells in your urine which is the reason we pain the culture. Read your instructions regarding specific complaints. Read the warning signs for when to note to seek medical attention promptly.

## 2016-01-09 LAB — URINE CULTURE: SPECIAL REQUESTS: NORMAL

## 2016-01-14 ENCOUNTER — Telehealth (HOSPITAL_COMMUNITY): Payer: Self-pay | Admitting: Emergency Medicine

## 2016-01-14 NOTE — Telephone Encounter (Signed)
-----   Message from Eustace MooreLaura W Murray, MD sent at 01/12/2016 12:17 PM EDT ----- Please let patient know that urine culture does not clearly demonstrate a UTI.   Recheck or followup with primary care provider/Dwight Williams for further evaluation if dizziness or other symptoms persist.  LM

## 2016-01-14 NOTE — Telephone Encounter (Signed)
Called pt and notified of recent lab results Pt ID'd properly... Reports sx are not getting any better or worse and still feeling dizzy.  Adv pt if sx are not getting better to return or to f/u w/PCP Pt verb understanding.

## 2016-12-05 ENCOUNTER — Ambulatory Visit (HOSPITAL_BASED_OUTPATIENT_CLINIC_OR_DEPARTMENT_OTHER)
Admit: 2016-12-05 | Discharge: 2016-12-05 | Disposition: A | Discharge status: Home / Self Care | Payer: Self-pay | Attending: Emergency Medicine | Admitting: Emergency Medicine

## 2016-12-05 ENCOUNTER — Ambulatory Visit (HOSPITAL_BASED_OUTPATIENT_CLINIC_OR_DEPARTMENT_OTHER)
Admission: RE | Admit: 2016-12-05 | Discharge: 2016-12-05 | Disposition: A | Discharge status: Home / Self Care | Payer: Self-pay | Source: Ambulatory Visit | Attending: Emergency Medicine | Admitting: Emergency Medicine

## 2016-12-05 ENCOUNTER — Encounter (HOSPITAL_BASED_OUTPATIENT_CLINIC_OR_DEPARTMENT_OTHER): Payer: Self-pay | Admitting: *Deleted

## 2016-12-05 ENCOUNTER — Emergency Department (HOSPITAL_BASED_OUTPATIENT_CLINIC_OR_DEPARTMENT_OTHER)
Admission: EM | Admit: 2016-12-05 | Discharge: 2016-12-05 | Disposition: A | Discharge status: Home / Self Care | Payer: Self-pay | Attending: Emergency Medicine | Admitting: Emergency Medicine

## 2016-12-05 DIAGNOSIS — R110 Nausea: Secondary | ICD-10-CM | POA: Insufficient documentation

## 2016-12-05 DIAGNOSIS — R1032 Left lower quadrant pain: Secondary | ICD-10-CM | POA: Insufficient documentation

## 2016-12-05 DIAGNOSIS — R6883 Chills (without fever): Secondary | ICD-10-CM | POA: Insufficient documentation

## 2016-12-05 LAB — PREGNANCY, URINE: Preg Test, Ur: NEGATIVE

## 2016-12-05 LAB — URINALYSIS, ROUTINE W REFLEX MICROSCOPIC
Bilirubin Urine: NEGATIVE
Glucose, UA: NEGATIVE mg/dL
Hgb urine dipstick: NEGATIVE
Ketones, ur: NEGATIVE mg/dL
NITRITE: NEGATIVE
PH: 6.5 (ref 5.0–8.0)
Protein, ur: NEGATIVE mg/dL
SPECIFIC GRAVITY, URINE: 1.008 (ref 1.005–1.030)

## 2016-12-05 LAB — URINALYSIS, MICROSCOPIC (REFLEX)

## 2016-12-05 MED ORDER — ONDANSETRON 8 MG PO TBDP: 8.0000 mg | ORAL_TABLET | Freq: Three times a day (TID) | ORAL | 0 refills | Status: DC | PRN

## 2016-12-05 MED ORDER — ONDANSETRON 8 MG PO TBDP
8.0000 mg | ORAL_TABLET | Freq: Once | ORAL | Status: AC
  Administered 2016-12-05: 8 mg via ORAL
  Filled 2016-12-05: qty 1

## 2016-12-05 NOTE — ED Provider Notes (Signed)
[facility] Provider Note: Lowella DellJ. Lane Thedore Pickel, MD, FACEP  CSN: [id number] MRN: [id number] ARRIVAL: 12/05/16 at 0337 ROOM: [facility]11/[facility]11   CHIEF COMPLAINT  Abdominal Pain   HISTORY OF PRESENT ILLNESS  12/05/16 4:02 AM Deborah BruinsLaquita Zirbes is a 36 y.o. female with about a one-week history of intermittent left lower quadrant pain. The pain is described as sharp and is rated as about a 5 out of 10 at its worst. The pain is not worse with movement or palpation. She has had over the same one week. Episodes of feeling hot but does not believe she has actually had a fever as coworkers told her she felt clammy at the time. She has also had nausea without vomiting or diarrhea. She is here this morning because she awoke with chills which is the first time this week she has had chills. She denies dysuria, hematuria or vaginal discharge. She has had some vaginal spotting but is due for her menses to start.    History reviewed. No pertinent past medical history.  History reviewed. No pertinent surgical history.  History reviewed. No pertinent family history.  Social History  Substance Use Topics  . Smoking status: Never Smoker  . Smokeless tobacco: Never Used  . Alcohol use No    Prior to Admission medications   Medication Sig Start Date End Date Taking? Authorizing Provider  ibuprofen (ADVIL,MOTRIN) 800 MG tablet Take 1 tablet (800 mg total) by mouth 3 (three) times daily. 09/01/15   Domenick GongMortenson, Ashley, MD  meclizine (ANTIVERT) 25 MG tablet Take one half to one tablet every 8 hours as needed for dizziness. May cause drowsiness. 01/07/16   Hayden RasmussenMabe, David, NP    Allergies Patient has no known allergies.   REVIEW OF SYSTEMS  Negative except as noted here or in the History of Present Illness.   PHYSICAL EXAMINATION  Initial Vital Signs Blood pressure (!) 136/104, pulse 90, temperature 98.1 F (36.7 C), resp. rate 16, height 5\' 6"  (1.676 m), weight 77.1 kg (170 lb), last menstrual period  11/09/2016, SpO2 100 %.  Examination General: Well-developed, well-nourished female in no acute distress; appearance consistent with age of record HENT: normocephalic; atraumatic Eyes: pupils equal, round and reactive to light; extraocular muscles intact Neck: supple Heart: regular rate and rhythm Lungs: clear to auscultation bilaterally Abdomen: soft; nondistended; nontender; no masses or hepatosplenomegaly; bowel sounds present GU: No CVA tenderness Extremities: No deformity; full range of motion; pulses normal Neurologic: Awake, alert and oriented; motor function intact in all extremities and symmetric; no facial droop Skin: Warm and dry Psychiatric: Normal mood and affect   RESULTS  Summary of this visit's results, reviewed by myself:   EKG Interpretation  Date/Time:    Ventricular Rate:    PR Interval:    QRS Duration:   QT Interval:    QTC Calculation:   R Axis:     Text Interpretation:        Laboratory Studies: Results for orders placed or performed during the hospital encounter of 12/05/16 (from the past 24 hour(s))  Pregnancy, urine     Status: None   Collection Time: 12/05/16  3:59 AM  Result Value Ref Range   Preg Test, Ur NEGATIVE NEGATIVE  Urinalysis, Routine w reflex microscopic     Status: Abnormal   Collection Time: 12/05/16  3:59 AM  Result Value Ref Range   Color, Urine YELLOW YELLOW   APPearance CLEAR CLEAR   Specific Gravity, Urine 1.008 1.005 - 1.030   pH 6.5  5.0 - 8.0   Glucose, UA NEGATIVE NEGATIVE mg/dL   Hgb urine dipstick NEGATIVE NEGATIVE   Bilirubin Urine NEGATIVE NEGATIVE   Ketones, ur NEGATIVE NEGATIVE mg/dL   Protein, ur NEGATIVE NEGATIVE mg/dL   Nitrite NEGATIVE NEGATIVE   Leukocytes, UA TRACE (A) NEGATIVE  Urinalysis, Microscopic (reflex)     Status: Abnormal   Collection Time: 12/05/16  3:59 AM  Result Value Ref Range   RBC / HPF 0-5 0 - 5 RBC/hpf   WBC, UA 0-5 0 - 5 WBC/hpf   Bacteria, UA RARE (A) NONE SEEN   Squamous  Epithelial / LPF 0-5 (A) NONE SEEN   Imaging Studies: No results found.  ED COURSE  Nursing notes and initial vitals signs, including pulse oximetry, reviewed.  Vitals:   12/05/16 0346 12/05/16 0347  BP: (!) 136/104   Pulse: 90   Resp: 16   Temp: 98.1 F (36.7 C)   SpO2: 100%   Weight:  77.1 kg (170 lb)  Height:  5\' 6"  (1.676 m)   4:27 AM The location and moderate severity of the patient's pain along with lack of tenderness makes me doubt an etiology such as kidney stone, diverticulitis or pelvic infection. I do not believe a CT scan is indicated at this time. We have left the patient return for pelvic ultrasound to evaluate for possible ovarian etiology.  PROCEDURES    ED DIAGNOSES     ICD-10-CM   1. Left lower quadrant pain R10.32   2. Nausea R11.0        Dareion Kneece, Jonny RuizJohn, MD 12/05/16 [phone]

## 2016-12-05 NOTE — ED Triage Notes (Signed)
Pt with intermittent LLQ pain x one week felt clammy last 2-3 days and woke this am with chills and nausea. Denies fevers V/D denies urinary sx denies abnormal vaginal bleeding or DC

## 2016-12-06 ENCOUNTER — Encounter (HOSPITAL_COMMUNITY): Payer: Self-pay | Admitting: Emergency Medicine

## 2016-12-06 ENCOUNTER — Emergency Department (HOSPITAL_COMMUNITY)
Admission: EM | Admit: 2016-12-06 | Discharge: 2016-12-06 | Disposition: A | Discharge status: Home / Self Care | Payer: Self-pay | Attending: Emergency Medicine | Admitting: Emergency Medicine

## 2016-12-06 DIAGNOSIS — R42 Dizziness and giddiness: Secondary | ICD-10-CM

## 2016-12-06 LAB — COMPREHENSIVE METABOLIC PANEL
ALBUMIN: 3.7 g/dL (ref 3.5–5.0)
ALT: 9 U/L — ABNORMAL LOW (ref 14–54)
ANION GAP: 7 (ref 5–15)
AST: 15 U/L (ref 15–41)
Alkaline Phosphatase: 52 U/L (ref 38–126)
BILIRUBIN TOTAL: 1.2 mg/dL (ref 0.3–1.2)
BUN: <5 mg/dL — ABNORMAL LOW (ref 6–20)
CHLORIDE: 105 mmol/L (ref 101–111)
CO2: 23 mmol/L (ref 22–32)
Calcium: 8.8 mg/dL — ABNORMAL LOW (ref 8.9–10.3)
Creatinine, Ser: 0.88 mg/dL (ref 0.44–1.00)
GFR calc Af Amer: >60 mL/min (ref >60)
GFR calc non Af Amer: >60 mL/min (ref >60)
GLUCOSE: 91 mg/dL (ref 65–99)
POTASSIUM: 3.6 mmol/L (ref 3.5–5.1)
SODIUM: 135 mmol/L (ref 135–145)
TOTAL PROTEIN: 6.6 g/dL (ref 6.5–8.1)

## 2016-12-06 LAB — CBC
HEMATOCRIT: 35.3 % — AB (ref 36.0–46.0)
HEMOGLOBIN: 11.7 g/dL — AB (ref 12.0–15.0)
MCH: 29.3 pg (ref 26.0–34.0)
MCHC: 33.1 g/dL (ref 30.0–36.0)
MCV: 88.3 fL (ref 78.0–100.0)
Platelets: 321 10*3/uL (ref 150–400)
RBC: 4 MIL/uL (ref 3.87–5.11)
RDW: 14.1 % (ref 11.5–15.5)
WBC: 4.4 10*3/uL (ref 4.0–10.5)

## 2016-12-06 LAB — URINALYSIS, ROUTINE W REFLEX MICROSCOPIC
Bilirubin Urine: NEGATIVE
GLUCOSE, UA: NEGATIVE mg/dL
HGB URINE DIPSTICK: NEGATIVE
Ketones, ur: NEGATIVE mg/dL
LEUKOCYTES UA: NEGATIVE
Nitrite: NEGATIVE
PH: 7 (ref 5.0–8.0)
Protein, ur: NEGATIVE mg/dL

## 2016-12-06 LAB — LIPASE, BLOOD: LIPASE: 25 U/L (ref 11–51)

## 2016-12-06 LAB — PREGNANCY, URINE: Preg Test, Ur: NEGATIVE

## 2016-12-06 NOTE — ED Notes (Signed)
As soon as patient arrived to room she walked by herself with her purse t the restroom and left her cell phone and ear buds on the bed. Urine cup given to patient to collect specimen.

## 2016-12-06 NOTE — ED Notes (Signed)
Pt verbalizes understanding of d/c instructions.

## 2016-12-06 NOTE — ED Notes (Signed)
Pt ambulatory to restroom

## 2016-12-06 NOTE — ED Provider Notes (Signed)
[facility] Provider Note   CSN: [id number] Arrival date & time: 12/06/16  1322     History   Chief Complaint Chief Complaint  Patient presents with  . Nausea  . Abdominal Pain  . Dizziness    HPI Deborah Grant is a 36 y.o. female.  HPI   Deborah Grant is a 36 y.o. female, patient with no pertinent past medical history, presenting to the ED with intermittent episodes of feeling hot then clammy while sitting at her desk at work. Occasional lightheadedness and nausea. Episodes will last for a few minutes. These have been occurring for about the last week. Also endorses constant, 5/10, "annoying," nonradiating left flank pain.  Denies alcohol or illicit drug use. Last bowel movement was this morning and was normal. Patient started her menstrual cycle this morning and states it has thus far been normal for her. She is a vegan, but has been on this type of diet for three years. She states she has incorporated more junk food into her regimen over the last three weeks or so. Denies HA, vomiting/diarrhea, falls/trauma, neuro deficits, hematochezia/melena, urinary complaints, shortness of breath, chest pain, or any other complaints.    History reviewed. No pertinent past medical history.  There are no active problems to display for this patient.   History reviewed. No pertinent surgical history.  OB History    No data available       Home Medications    Prior to Admission medications   Medication Sig Start Date End Date Taking? Authorizing Provider  ondansetron (ZOFRAN ODT) 8 MG disintegrating tablet Take 1 tablet (8 mg total) by mouth every 8 (eight) hours as needed for nausea or vomiting. 12/05/16   Molpus, Jonny RuizJohn, MD    Family History History reviewed. No pertinent family history.  Social History Social History  Substance Use Topics  . Smoking status: Never Smoker  . Smokeless tobacco: Never Used  . Alcohol use No     Allergies   Patient has no known  allergies.   Review of Systems Review of Systems  Constitutional: Negative for chills and fever.  Respiratory: Negative for shortness of breath.   Cardiovascular: Negative for chest pain.  Gastrointestinal: Positive for nausea. Negative for abdominal pain and vomiting.  Genitourinary: Positive for flank pain. Negative for dysuria, frequency, hematuria and vaginal discharge.  Neurological: Positive for light-headedness. Negative for syncope, weakness and numbness.  All other systems reviewed and are negative.    Physical Exam Updated Vital Signs BP (!) 134/97   Pulse 74   Temp 98.4 F (36.9 C) (Oral)   Resp 20   Ht 5\' 6"  (1.676 m)   Wt 75.8 kg (167 lb)   LMP 12/06/2016   SpO2 100%   BMI 26.95 kg/m   Physical Exam  Constitutional: She is oriented to person, place, and time. She appears well-developed and well-nourished. No distress.  HENT:  Head: Normocephalic and atraumatic.  Mouth/Throat: Oropharynx is clear and moist.  Eyes: Pupils are equal, round, and reactive to light. Conjunctivae and EOM are normal.  Neck: Normal range of motion. Neck supple.  Cardiovascular: Normal rate, regular rhythm, normal heart sounds and intact distal pulses.   Pulmonary/Chest: Effort normal and breath sounds normal. No respiratory distress.  Abdominal: Soft. Normal appearance and bowel sounds are normal. There is no tenderness. There is no guarding and no CVA tenderness.  Patient has absolutely no tenderness to the abdomen.  Musculoskeletal: She exhibits no edema.  Normal motor function intact in  all extremities and spine. No midline spinal tenderness.   Lymphadenopathy:    She has no cervical adenopathy.  Neurological: She is alert and oriented to person, place, and time.  No sensory deficits. Strength 5/5 in all extremities. No gait disturbance. Coordination intact including heel to shin and finger to nose. Cranial nerves III-XII grossly intact. No facial droop. No vertical nystagmus.    Skin: Skin is warm and dry. She is not diaphoretic.  Psychiatric: She has a normal mood and affect. Her behavior is normal.  Nursing note and vitals reviewed.    ED Treatments / Results  Labs (all labs ordered are listed, but only abnormal results are displayed) Labs Reviewed  COMPREHENSIVE METABOLIC PANEL - Abnormal; Notable for the following:       Result Value   BUN <5 (*)    Calcium 8.8 (*)    ALT 9 (*)    All other components within normal limits  CBC - Abnormal; Notable for the following:    Hemoglobin 11.7 (*)    HCT 35.3 (*)    All other components within normal limits  URINALYSIS, ROUTINE W REFLEX MICROSCOPIC - Abnormal; Notable for the following:    Specific Gravity, Urine <1.005 (*)    All other components within normal limits  LIPASE, BLOOD  PREGNANCY, URINE    EKG  EKG Interpretation  Date/Time:  Sunday December 06 2016 13:42:55 EDT Ventricular Rate:  83 PR Interval:    QRS Duration: 78 QT Interval:  368 QTC Calculation: 433 R Axis:   34 Text Interpretation:  Sinus rhythm Borderline T abnormalities, anterior leads since last tracing no significant change Confirmed by Mancel BaleWentz, Elliott [phone]([id number]) on 12/06/2016 1:55:46 PM       Radiology [place] Transvaginal Non-ob  Result Date: 12/05/2016 CLINICAL DATA:  36 year old female with left lower quadrant pain and nausea for the past week EXAM: TRANSABDOMINAL AND TRANSVAGINAL ULTRASOUND OF PELVIS TECHNIQUE: Both transabdominal and transvaginal ultrasound examinations of the pelvis were performed. Transabdominal technique was performed for global imaging of the pelvis including uterus, ovaries, adnexal regions, and pelvic cul-de-sac. It was necessary to proceed with endovaginal exam following the transabdominal exam to visualize the endometrium. COMPARISON:  None FINDINGS: Uterus Measurements: 8.2 x 5.0 x 5.7 cm. No fibroids or other mass visualized. Endometrium Thickness: 9 mm.  No focal abnormality visualized. Right ovary  Measurements: 3.1 x 2.1 x 2.5 cm. Normal appearance/no adnexal mass. Left ovary Measurements: 2.6 x 1.7 x 2.0 cm. Normal appearance/no adnexal mass. Other findings No abnormal free fluid. IMPRESSION: Normal pelvic ultrasound. Electronically Signed   By: Malachy MoanHeath  McCullough M.D.   On: 12/05/2016 11:00   [place] Pelvis Complete  Result Date: 12/05/2016 CLINICAL DATA:  36 year old female with left lower quadrant pain and nausea for the past week EXAM: TRANSABDOMINAL AND TRANSVAGINAL ULTRASOUND OF PELVIS TECHNIQUE: Both transabdominal and transvaginal ultrasound examinations of the pelvis were performed. Transabdominal technique was performed for global imaging of the pelvis including uterus, ovaries, adnexal regions, and pelvic cul-de-sac. It was necessary to proceed with endovaginal exam following the transabdominal exam to visualize the endometrium. COMPARISON:  None FINDINGS: Uterus Measurements: 8.2 x 5.0 x 5.7 cm. No fibroids or other mass visualized. Endometrium Thickness: 9 mm.  No focal abnormality visualized. Right ovary Measurements: 3.1 x 2.1 x 2.5 cm. Normal appearance/no adnexal mass. Left ovary Measurements: 2.6 x 1.7 x 2.0 cm. Normal appearance/no adnexal mass. Other findings No abnormal free fluid. IMPRESSION: Normal pelvic ultrasound. Electronically Signed   By: Vilma PraderHeath  Archer Asa M.D.   On: 12/05/2016 11:00    Procedures Procedures (including critical care time)  Medications Ordered in ED Medications - No data to display   Initial Impression / Assessment and Plan / ED Course  I have reviewed the triage vital signs and the nursing notes.  Pertinent labs & imaging results that were available during my care of the patient were reviewed by me and considered in my medical decision making (see chart for details).     Patient presents with feelings of lightheadedness. Patient is nontoxic appearing, afebrile, not tachycardic, not tachypneic, not hypotensive, maintains SPO2 of 98-100% on room air,  and is in no apparent distress. Patient's symptoms do not seem to be consistent with vertigo. Her symptoms are not exertional. Patient has no abnormalities on exam. No neuro or functional deficits. No tenderness in the abdomen. Pain is not consistent with renal or biliary colic. Shared decision making was used regarding CT scans. Patient declined these scans. Patient was observed in the ED without recurrence of her symptoms. She is able to ambulate without difficulty, hesitation, or assistance. PCP follow-up. Resources given. The patient was given instructions for home care as well as return precautions. Patient voices understanding of these instructions, accepts the plan, and is comfortable with discharge.   Findings and plan of care discussed with Alvira Monday, MD.    Vitals:   12/06/16 1344 12/06/16 1400 12/06/16 1500 12/06/16 1530  BP: 121/87 (!) 134/97 (!) 130/96 (!) 134/99  Pulse: 80 74 78 73  Resp: (!) 22 20 (!) 21 16  Temp: 98.4 F (36.9 C)     TempSrc: Oral     SpO2: 99% 100% 98% 98%  Weight:      Height:          Final Clinical Impressions(s) / ED Diagnoses   Final diagnoses:  Intermittent lightheadedness    New Prescriptions Discharge Medication List as of 12/06/2016  4:14 PM       Anselm Pancoast, PA-C 12/07/16 6045    Alvira Monday, MD 12/12/16 1455

## 2016-12-06 NOTE — ED Triage Notes (Signed)
Pt has had dizziness for two days along with nausea. Denies any other neuro symptoms. BP for EMS 145/100, HR 83 CBG 105. PT also complaining of LLQ abd pain. Menstrual cycle started today.

## 2016-12-06 NOTE — Discharge Instructions (Signed)
Your labs today show no acute or significant abnormalities. Your overall presentation is encouraging. Please follow up with a primary care provider for any further management of this issue. Be sure to eat regular, nutritious meals, get plenty of rest, and stay well-hydrated. Return to the ED for any worsening symptoms.

## 2018-12-07 IMAGING — US US TRANSVAGINAL NON-OB
1 series · 14 of 25 positions shown · non-contrast
Comparison: None

CLINICAL DATA: 35-year-old female with left lower quadrant pain and
nausea for the past week



[Series 1: us transvaginal non-ob · 0.24mm/px · 14 of 55 slices shown]
[im 1/55]
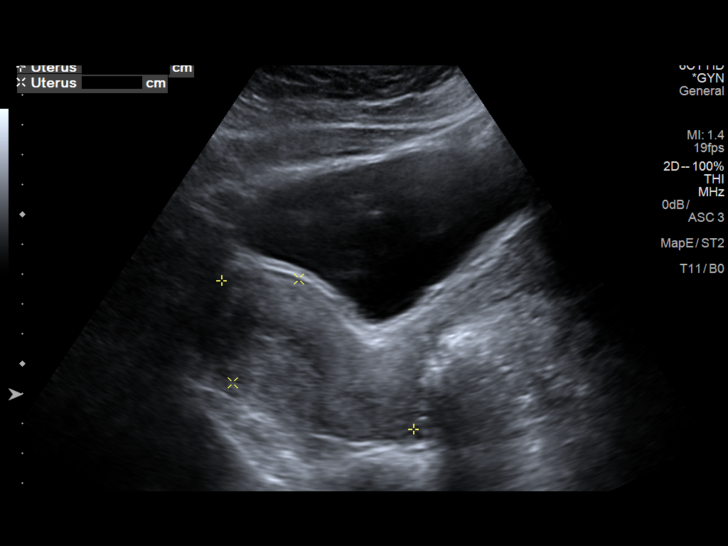
[im 5/55]
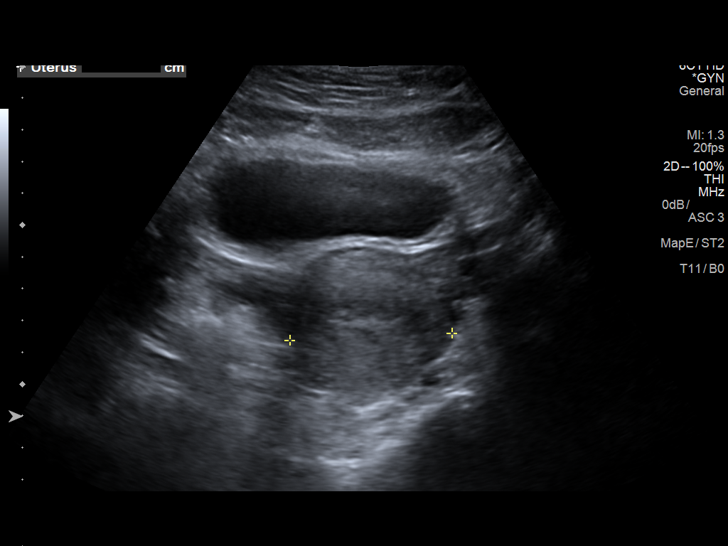
[im 10/55]
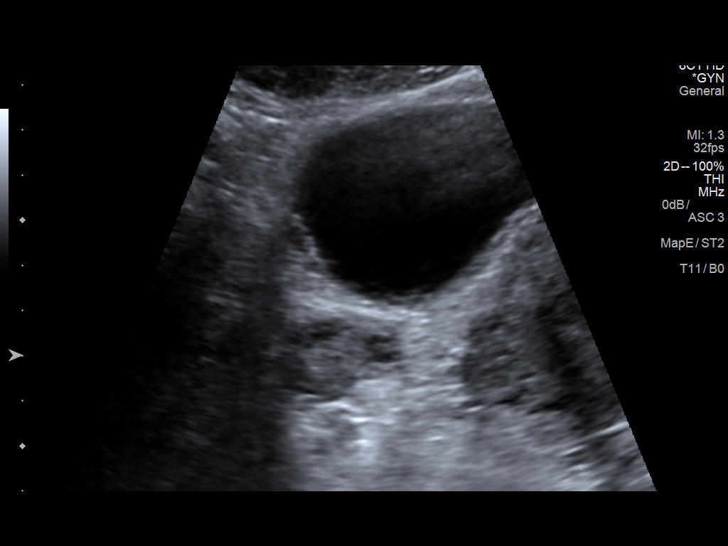
[im 14/55]
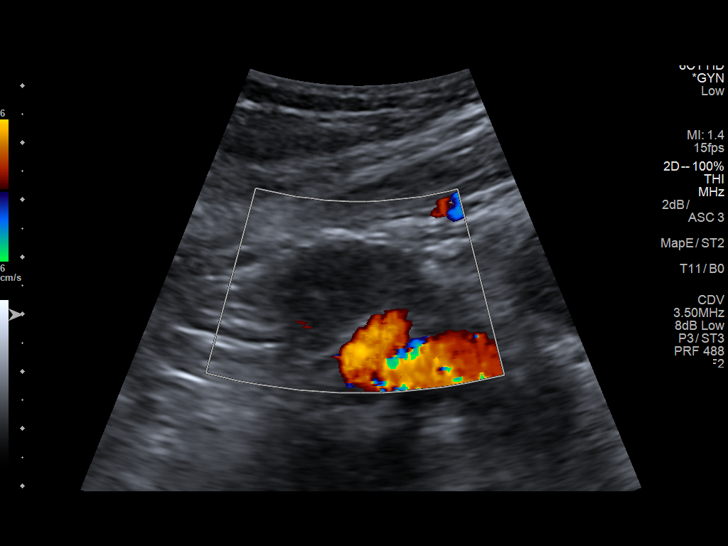
[im 19/55]
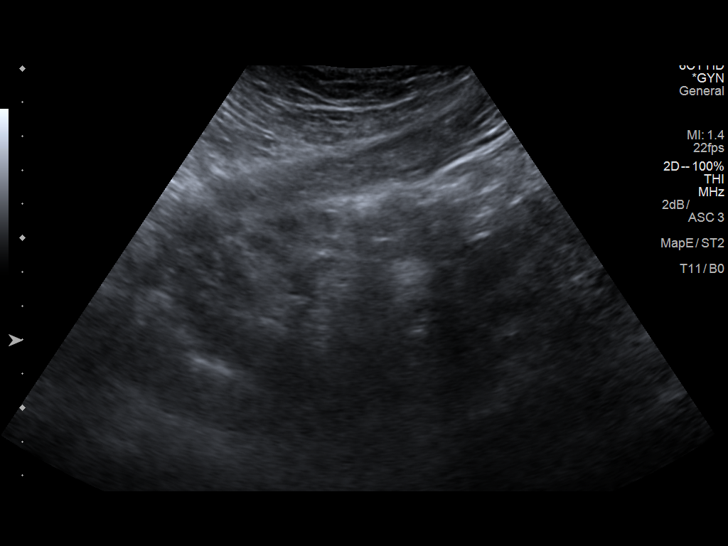
[im 21/55]
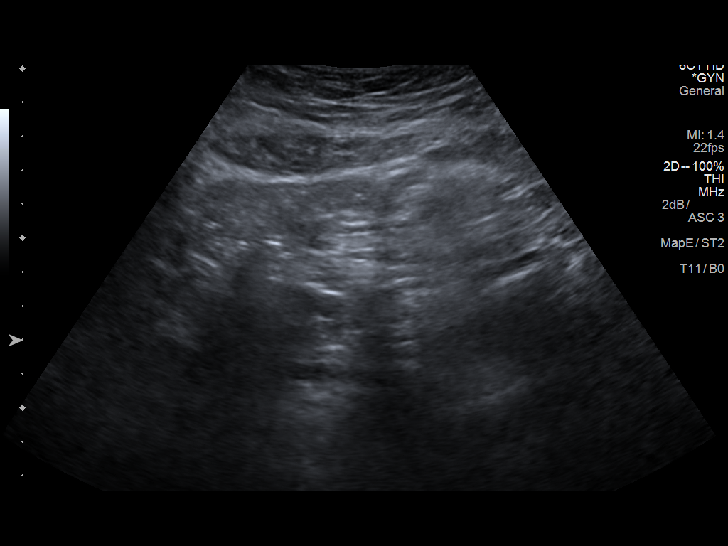
[im 25/55]
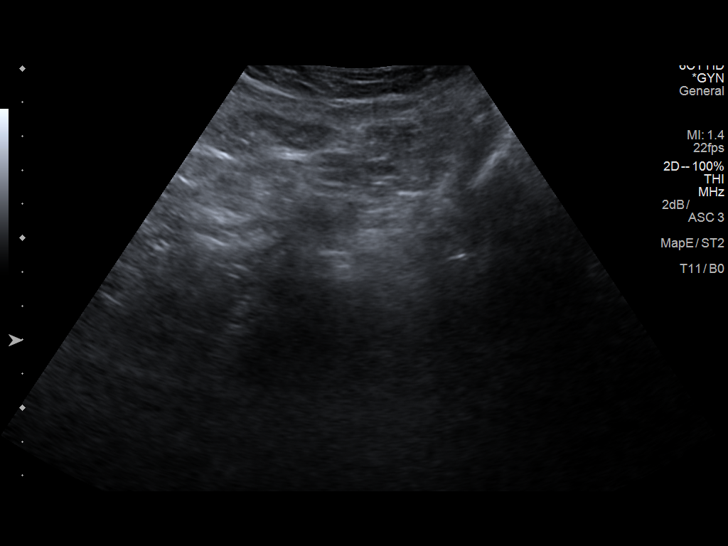
[im 30/55]
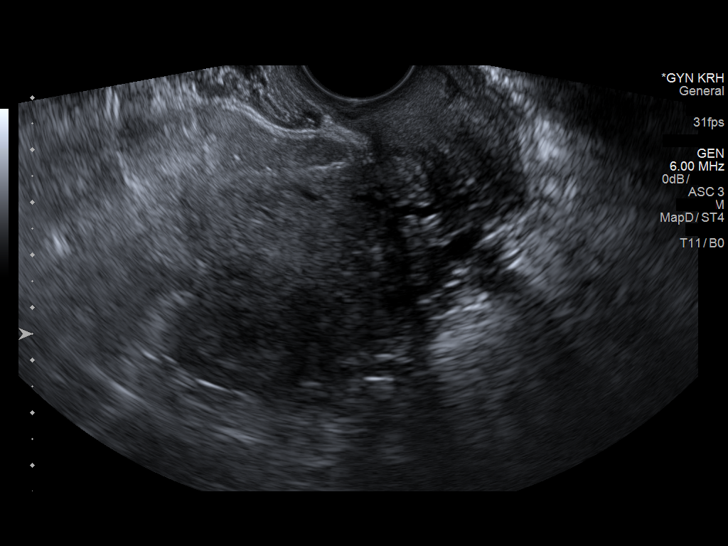
[im 34/55]
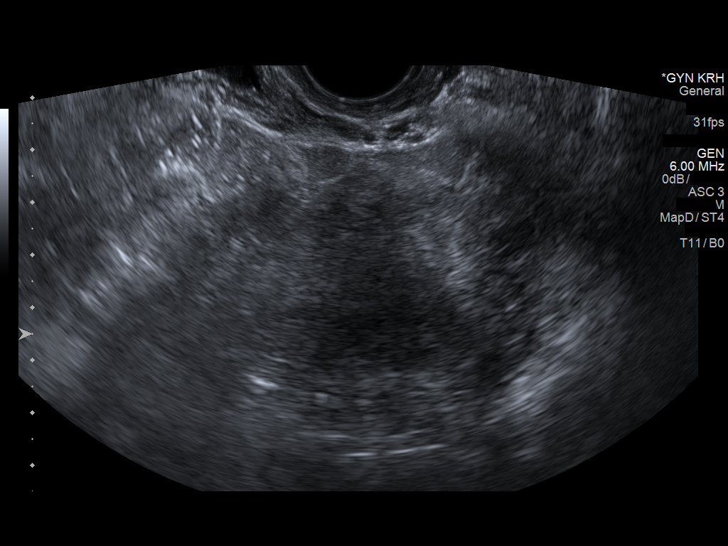
[im 37/55]
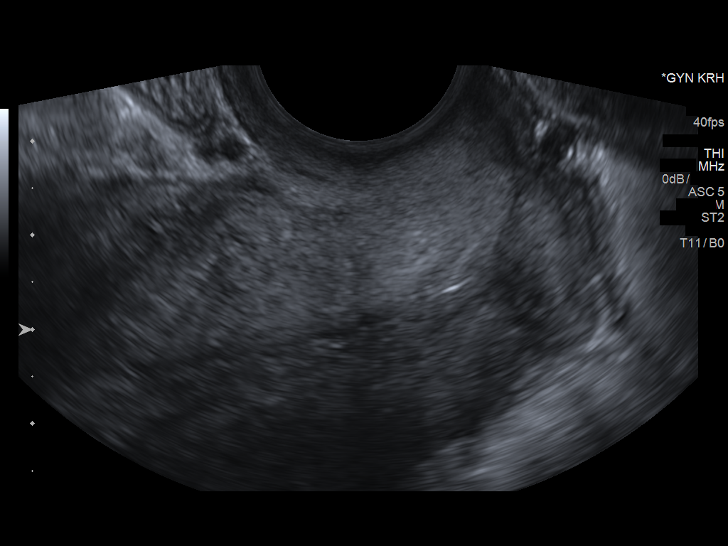
[im 41/55]
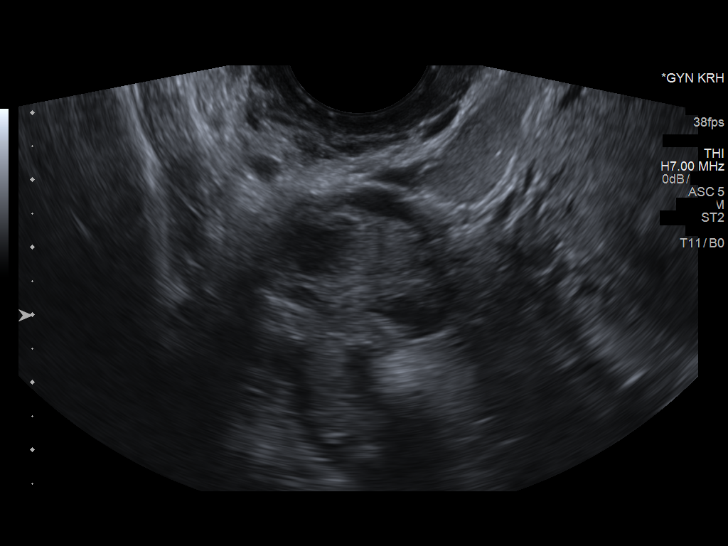
[im 46/55]
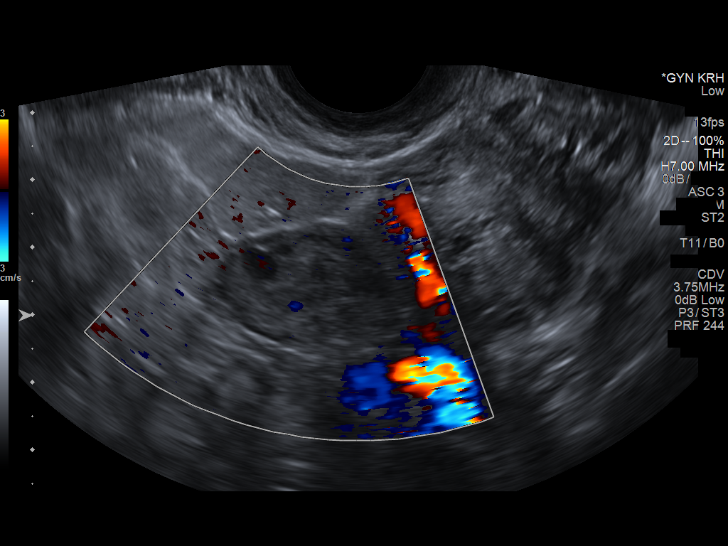
[im 50/55]
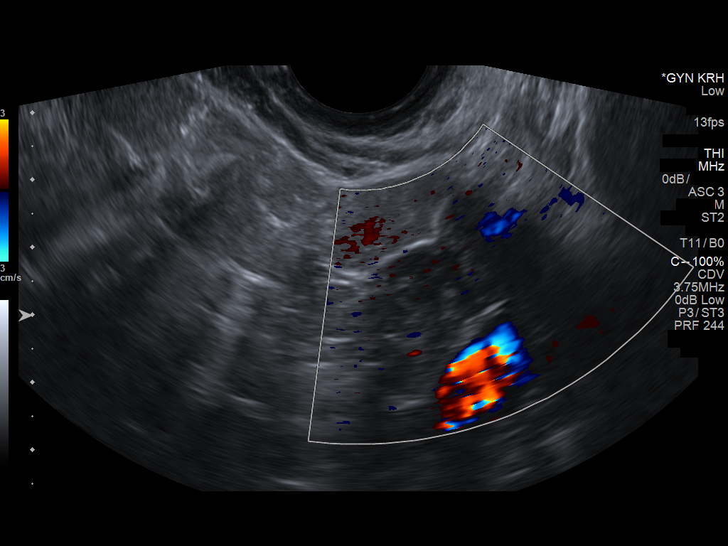
[im 55/55]
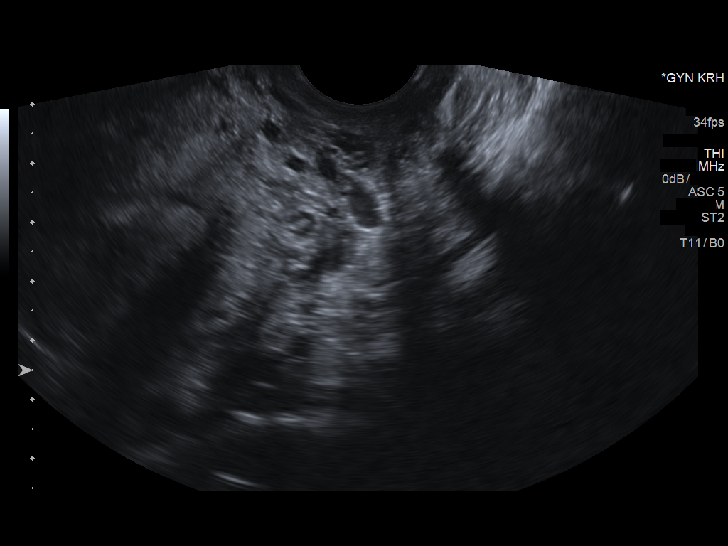

[14 of 25 positions shown; findings below may reference images not displayed]

FINDINGS: Uterus

Measurements: 8.2 x 5.0 x 5.7 cm. No fibroids or other mass
visualized.

Endometrium

Thickness: 9 mm.  No focal abnormality visualized.

Right ovary

Measurements: 3.1 x 2.1 x 2.5 cm. Normal appearance/no adnexal mass.

Left ovary

Measurements: 2.6 x 1.7 x 2.0 cm. Normal appearance/no adnexal mass.

Other findings

No abnormal free fluid.
IMPRESSION: Normal pelvic ultrasound.

## 2021-11-14 ENCOUNTER — Encounter (HOSPITAL_BASED_OUTPATIENT_CLINIC_OR_DEPARTMENT_OTHER): Payer: Self-pay | Admitting: Obstetrics and Gynecology

## 2021-11-14 ENCOUNTER — Other Ambulatory Visit: Payer: Self-pay

## 2021-11-14 ENCOUNTER — Emergency Department (HOSPITAL_BASED_OUTPATIENT_CLINIC_OR_DEPARTMENT_OTHER): Payer: Self-pay | Admitting: Radiology

## 2021-11-14 ENCOUNTER — Emergency Department (HOSPITAL_BASED_OUTPATIENT_CLINIC_OR_DEPARTMENT_OTHER)
Admission: EM | Admit: 2021-11-14 | Discharge: 2021-11-14 | Disposition: A | Discharge status: Home / Self Care | Payer: Self-pay | Attending: Emergency Medicine | Admitting: Emergency Medicine

## 2021-11-14 DIAGNOSIS — R002 Palpitations: Secondary | ICD-10-CM | POA: Insufficient documentation

## 2021-11-14 DIAGNOSIS — F439 Reaction to severe stress, unspecified: Secondary | ICD-10-CM | POA: Insufficient documentation

## 2021-11-14 DIAGNOSIS — R42 Dizziness and giddiness: Secondary | ICD-10-CM | POA: Insufficient documentation

## 2021-11-14 DIAGNOSIS — R079 Chest pain, unspecified: Secondary | ICD-10-CM

## 2021-11-14 LAB — CBC
HCT: 38.4 % (ref 36.0–46.0)
Hemoglobin: 12.6 g/dL (ref 12.0–15.0)
MCH: 28.6 pg (ref 26.0–34.0)
MCHC: 32.8 g/dL (ref 30.0–36.0)
MCV: 87.3 fL (ref 80.0–100.0)
Platelets: 256 10*3/uL (ref 150–400)
RBC: 4.4 MIL/uL (ref 3.87–5.11)
RDW: 13.5 % (ref 11.5–15.5)
WBC: 5.1 10*3/uL (ref 4.0–10.5)
nRBC: 0 % (ref 0.0–0.2)

## 2021-11-14 LAB — BASIC METABOLIC PANEL
Anion gap: 10 (ref 5–15)
BUN: <5 mg/dL — ABNORMAL LOW (ref 6–20)
CO2: 26 mmol/L (ref 22–32)
Calcium: 9.8 mg/dL (ref 8.9–10.3)
Chloride: 103 mmol/L (ref 98–111)
Creatinine, Ser: 0.9 mg/dL (ref 0.44–1.00)
GFR, Estimated: >60 mL/min (ref >60)
Glucose, Bld: 94 mg/dL (ref 70–99)
Potassium: 3.7 mmol/L (ref 3.5–5.1)
Sodium: 139 mmol/L (ref 135–145)

## 2021-11-14 LAB — TROPONIN I (HIGH SENSITIVITY): Troponin I (High Sensitivity): <2 ng/L (ref <18)

## 2021-11-14 NOTE — ED Notes (Signed)
Discharge paperwork given and understood. 

## 2021-11-14 NOTE — ED Notes (Signed)
Intermittent Dizziness/palpitations on going for the last 3 weeks. Current episode started this morning after waking up. Noted palpitations/flutter in chest. No N/V/D or current lightheaded/feeling faint. Has had 2 moments of the flutter while being on the heart monitor. No recent injury. No numbness/tingling. No known hx or on any meds.

## 2021-11-14 NOTE — ED Provider Notes (Signed)
I saw and evaluated the patient, reviewed the resident's note and I agree with the findings and plan.  EKG Interpretation  Date/Time:  Friday November 14 2021 11:16:25 EDT Ventricular Rate:  86 PR Interval:  163 QRS Duration: 81 QT Interval:  379 QTC Calculation: 454 R Axis:   16 Text Interpretation: Sinus rhythm Abnormal R-wave progression, early transition Borderline T abnormalities, anterior leads No significant change since last tracing Confirmed by Lorre Nick ([id number]) on 11/14/2021 11:42:84 AM   41 year old female presents with intermittent chest discomfort lasting for several seconds over the past couple of weeks.  Has had increased stress at home.  EKG per my interpretation shows no signs of acute ischemic changes.  Low suspicion for ACS.  Patient has a heart score of 0.  We will do 1 troponin and likely discharge       Lorre Nick, MD 11/14/21 1240

## 2021-11-14 NOTE — Discharge Instructions (Addendum)
We discussed your diagnosis of chest pain.  Please follow-up with your primary care doctor as well as cardiology for your symptoms.  Please return to the ED if you develop new or worsening symptoms including but not limited to chest pain, shortness of breath, palpitations, syncope.

## 2021-11-14 NOTE — ED Triage Notes (Signed)
Patient reports to the ER for palpitations and dizziness. Patient reports she has been feeling the palpitations off and on x3 weeks but this week it has gotten worse and she has been having increased dizziness.

## 2021-11-14 NOTE — ED Provider Notes (Signed)
[facility] EMERGENCY DEPT Provider Note   CSN: [id number] Arrival date & time: 11/14/21  1102     History  Chief Complaint  Patient presents with   Palpitations    Deborah Grant is a 41 y.o. female with no past medical history that presents with 3 weeks of intermittent palpitations and lightheadedness.  The patient states that the palpitations and lightheadedness can occur independently but occasionally occur at the same time.  Patient also states that she develops sharp 5/10 left-sided chest pain during the episodes of palpitations.  She states that this pain does not radiate and denies any transforming factors. Her most recent episode occurred today but she states that she is not currently having chest pain.  The patient states that her symptoms occur while at rest and while up and moving around. The patient is not sure of any precipitating factors. She has not taken any medication for her symptoms.  She states that she was seen by her primary care doctor for her symptoms this week where she had an EKG done that was negative.  She states that her primary care doctor referred her to cardiology but that she has not been called to schedule an appointment yet.  The patient denies swelling, loss of consciousness, vision changes, weakness, sensory changes, shortness of breath, headache, fever, chills, abdominal pain, nausea, vomiting, diarrhea, dysuria.  She denies history of hypertension, diabetes, and disorders of the heart.  She denies taking any medications regularly.  The history is provided by the patient.  Palpitations Associated symptoms: chest pain   Associated symptoms: no nausea, no vomiting and no weakness        Home Medications Prior to Admission medications   Medication Sig Start Date End Date Taking? Authorizing Provider  ondansetron (ZOFRAN ODT) 8 MG disintegrating tablet Take 1 tablet (8 mg total) by mouth every 8 (eight) hours as needed for nausea or  vomiting. 12/05/16   Molpus, Jonny Ruiz, MD      Allergies    Patient has no known allergies.    Review of Systems   Review of Systems  Constitutional:  Negative for chills and fever.  Eyes:  Negative for visual disturbance.  Cardiovascular:  Positive for chest pain and palpitations. Negative for leg swelling.  Gastrointestinal:  Negative for diarrhea, nausea and vomiting.  Genitourinary:  Negative for dysuria.  Neurological:  Positive for light-headedness. Negative for syncope, weakness and headaches.       Denies sensory changes.     Physical Exam Updated Vital Signs BP (!) 133/93 (BP Location: Right Arm)   Pulse 82   Temp 98.5 F (36.9 C) (Oral)   Resp 12   Ht 5\' 6"  (1.676 m)   Wt 78.8 kg   LMP 11/11/2021 (Approximate)   SpO2 98%   BMI 28.05 kg/m  Physical Exam Constitutional:      Appearance: She is not ill-appearing.  HENT:     Mouth/Throat:     Mouth: Mucous membranes are moist.  Eyes:     Extraocular Movements: Extraocular movements intact.     Pupils: Pupils are equal, round, and reactive to light.  Cardiovascular:     Rate and Rhythm: Normal rate and regular rhythm.     Pulses: Normal pulses.     Heart sounds: No murmur heard.    No friction rub. No gallop.  Pulmonary:     Effort: Pulmonary effort is normal.     Breath sounds: No wheezing, rhonchi or rales.  Musculoskeletal:  Right lower leg: No edema.     Left lower leg: No edema.     Comments: No tenderness to palpation of the chest.   Neurological:     General: No focal deficit present.     Mental Status: She is alert.     Cranial Nerves: No cranial nerve deficit.     Sensory: No sensory deficit.     Motor: No weakness.     ED Results / Procedures / Treatments   Labs (all labs ordered are listed, but only abnormal results are displayed) Labs Reviewed  CBC  BASIC METABOLIC PANEL    EKG EKG Interpretation  Date/Time:  Friday November 14 2021 11:16:25 EDT Ventricular Rate:  86 PR  Interval:  163 QRS Duration: 81 QT Interval:  379 QTC Calculation: 454 R Axis:   16 Text Interpretation: Sinus rhythm Abnormal R-wave progression, early transition Borderline T abnormalities, anterior leads No significant change since last tracing Confirmed by Lorre Nick ([id number]) on 11/14/2021 11:23:29 AM  Radiology No results found.  Procedures Procedures    Medications Ordered in ED Medications - No data to display  ED Course/ Medical Decision Making/ A&P                           Medical Decision Making Deborah Grant is a 41 y.o. female with no past medical history that presents with 3 weeks of intermittent palpitations, chest pain, and lightheadedness. Differential diagnosis includes but is not limited to acute coronary syndrome, electrolyte derangement.  I have a low suspicion for acute coronary syndrome given heart score of 0. Will order CBC and BMP to assess cell counts, electrolytes, renal function.  We will order CXR, EKG, and troponin to rule out acute coronary syndrome. Patient discussed with Dr. Freida Busman.  Amount and/or Complexity of Data Reviewed Labs: ordered. Decision-making details documented in ED Course. Radiology: ordered and independent interpretation performed. Decision-making details documented in ED Course.  12:45 PM Patient CBC and BMP are unremarkable.  Patient's EKG is normal.  Awaiting results of chest x-ray and troponin.  1:41 PM Patient's chest x-ray is negative for acute cardiopulmonary syndrome.  Her troponin is negative.  My suspicion for acute coronary syndrome is low.  Discussed with patient plan to follow-up with her primary care doctor as well as cardiology for her symptoms.  Discussed with patient plan to return to the ED if she develops new or worsening symptoms including but not limited to chest pain, shortness of breath, palpitations, syncope.  Patient agrees with the plan.         Final Clinical Impression(s) / ED Diagnoses Final  diagnoses:  None    Rx / DC Orders ED Discharge Orders     None         Deborah Grant, Gaylyn Cheers, MD 11/14/21 1352    Lorre Nick, MD 11/17/21 1431

## 2021-11-16 NOTE — Progress Notes (Unsigned)
Cardiology Office Note:    Date:  11/18/2021   ID:  Deborah Grant, DOB January 02, 1981, MRN [id number]  PCP:  Deborah Panda, NP   [facility] Providers Cardiologist:  None     Referring MD: Deborah Panda, NP   No chief complaint on file. Palpitations  History of Present Illness:    Deborah Grant is a 41 y.o. female with no pmhx, referral for palpitations. She denies dizziness, syncope, caffeine intake, etoh, family hx of SCD. She has no known structural heart dx. She can get LH. She presented to the ED. EKG showed NSR. Noted TSH was normal February. She drinks tea but cut back on this. The palpitations have continue. She has heart fluttering every day. She's on not any medications   Current Medications: No outpatient medications have been marked as taking for the 11/18/21 encounter (Office Visit) with Maisie Fus, MD.     Allergies:   Patient has no known allergies.   Social History   Socioeconomic History   Marital status: Single    Spouse name: Not on file   Number of children: Not on file   Years of education: Not on file   Highest education level: Not on file  Occupational History   Not on file  Tobacco Use   Smoking status: Never    Passive exposure: Never   Smokeless tobacco: Never  Vaping Use   Vaping Use: Never used  Substance and Sexual Activity   Alcohol use: No   Drug use: No   Sexual activity: Not Currently    Birth control/protection: Abstinence  Other Topics Concern   Not on file  Social History Narrative   Not on file   Social Determinants of Health   Financial Resource Strain: Not on file  Food Insecurity: Not on file  Transportation Needs: Not on file  Physical Activity: Not on file  Stress: Not on file  Social Connections: Not on file     Family History: No family hx of heart disease  ROS:   Please see the history of present illness.     All other systems reviewed and are negative.  EKGs/Labs/Other Studies  Reviewed:    The following studies were reviewed today:   EKG:  EKG is  ordered today.  The ekg ordered today demonstrates   11/18/2021-Sinus tachycardia HR 108 bpm  Recent Labs: 11/14/2021: BUN <5; Creatinine, Ser 0.90; Hemoglobin 12.6; Platelets 256; Potassium 3.7; Sodium 139   Recent Lipid Panel No results found for: "CHOL", "TRIG", "HDL", "CHOLHDL", "VLDL", "LDLCALC", "LDLDIRECT"   Risk Assessment/Calculations:           Physical Exam:    VS:  BP (!) 128/96   Pulse (!) 108   Ht 5\' 6"  (1.676 m)   Wt 172 lb 12.8 oz (78.4 kg)   LMP 11/11/2021 (Approximate)   SpO2 98%   BMI 27.89 kg/m     Wt Readings from Last 3 Encounters:  11/18/21 172 lb 12.8 oz (78.4 kg)  11/14/21 173 lb 12.8 oz (78.8 kg)  12/06/16 167 lb (75.8 kg)     GEN:  Well nourished, well developed in no acute distress HEENT: Normal NECK: No JVD LYMPHATICS: No lymphadenopathy CARDIAC: RRR, no murmurs, rubs, gallops RESPIRATORY:  Clear to auscultation without rales, wheezing or rhonchi  ABDOMEN: Soft, non-tender, non-distended MUSCULOSKELETAL:  No edema; No deformity  SKIN: Warm and dry NEUROLOGIC:  Alert and oriented x 3 PSYCHIATRIC:  Normal affect   ASSESSMENT:    Palpitations:  She does not have high risk features including syncope c/f arrhythmia , family hx of SCD, or abnormalities on her EKG.  PLAN:    In order of problems listed above:  Ziopatch 3 days        Medication Adjustments/Labs and Tests Ordered: Current medicines are reviewed at length with the patient today.  Concerns regarding medicines are outlined above.  Orders Placed This Encounter  Procedures   LONG TERM MONITOR (3-14 DAYS)   EKG 12-Lead   No orders of the defined types were placed in this encounter.   There are no Patient Instructions on file for this visit.   Signed, Maisie Fus, MD  11/18/2021 10:17 AM    [facility]

## 2021-11-18 ENCOUNTER — Encounter: Payer: Self-pay | Admitting: Internal Medicine

## 2021-11-18 ENCOUNTER — Ambulatory Visit (INDEPENDENT_AMBULATORY_CARE_PROVIDER_SITE_OTHER): Payer: Medicaid Other

## 2021-11-18 ENCOUNTER — Ambulatory Visit (INDEPENDENT_AMBULATORY_CARE_PROVIDER_SITE_OTHER): Payer: Self-pay | Admitting: Internal Medicine

## 2021-11-18 VITALS — BP 128/96 | HR 108 | Ht 66.0 in | Wt 172.8 lb

## 2021-11-18 DIAGNOSIS — R002 Palpitations: Secondary | ICD-10-CM

## 2021-11-18 NOTE — Progress Notes (Unsigned)
Enrolled patient for a 3 day Zio XT monitor to be mailed to patients home  

## 2021-11-18 NOTE — Patient Instructions (Signed)
Medication Instructions:  No Changes In Medications at this time.  *If you need a refill on your cardiac medications before your next appointment, please call your pharmacy*  Lab Work: None Ordered At This Time.  If you have labs (blood work) drawn today and your tests are completely normal, you will receive your results only by: MyChart Message (if you have MyChart) OR A paper copy in the mail If you have any lab test that is abnormal or we need to change your treatment, we will call you to review the results.  Testing/Procedures:  ZIO XT- Long Term Monitor Instructions   Your physician has requested you wear your ZIO patch monitor___3____days.   This is a single patch monitor.  [employer] supplies one patch monitor per enrollment.  Additional stickers are not available.   Please do not apply patch if you will be having a Nuclear Stress Test, Echocardiogram, Cardiac CT, MRI, or Chest Xray during the time frame you would be wearing the monitor. The patch cannot be worn during these tests.  You cannot remove and re-apply the ZIO XT patch monitor.   Your ZIO patch monitor will be sent USPS Priority mail from [employer] directly to your home address. The monitor may also be mailed to a PO BOX if home delivery is not available.   It may take 3-5 days to receive your monitor after you have been enrolled.   Once you have received you monitor, please review enclosed instructions.  Your monitor has already been registered assigning a specific monitor serial # to you.   Applying the monitor   Shave hair from upper left chest.   Hold abrader disc by orange tab.  Rub abrader in 40 strokes over left upper chest as indicated in your monitor instructions.   Clean area with 4 enclosed alcohol pads .  Use all pads to assure are is cleaned thoroughly.  Let dry.   Apply patch as indicated in monitor instructions.  Patch will be place under collarbone on left side of chest with arrow pointing  upward.   Rub patch adhesive wings for 2 minutes.Remove white label marked "1".  Remove white label marked "2".  Rub patch adhesive wings for 2 additional minutes.   While looking in a mirror, press and release button in center of patch.  A small green light will flash 3-4 times .  This will be your only indicator the monitor has been turned on.     Do not shower for the first 24 hours.  You may shower after the first 24 hours.   Press button if you feel a symptom. You will hear a small click.  Record Date, Time and Symptom in the Patient Log Book.   When you are ready to remove patch, follow instructions on last 2 pages of Patient Log Book.  Stick patch monitor onto last page of Patient Log Book.   Place Patient Log Book in Blue box.  Use locking tab on box and tape box closed securely.  The Orange and White box has prepaid postage on it.  Please place in mailbox as soon as possible.  Your physician should have your test results approximately 7 days after the monitor has been mailed back to [employer].   Call [employer] Customer Care at [phone] if you have questions regarding your ZIO XT patch monitor.  Call them immediately if you see an orange light blinking on your monitor.   If your monitor falls off in less   than 4 days contact our Monitor department at [phone].  If your monitor becomes loose or falls off after 4 days call [employer] at [phone] for suggestions on securing your monitor.   Follow-Up: At [facility], you and your health needs are our priority.  As part of our continuing mission to provide you with exceptional heart care, we have created designated Provider Care Teams.  These Care Teams include your primary Cardiologist (physician) and Advanced Practice Providers (APPs -  Physician Assistants and Nurse Practitioners) who all work together to provide you with the care you need, when you need it.  Your next appointment:   AS NEEDED   The format for  your next appointment:   In Person  Provider:   Branch, Mary E, MD       

## 2022-05-11 ENCOUNTER — Emergency Department (HOSPITAL_BASED_OUTPATIENT_CLINIC_OR_DEPARTMENT_OTHER)
Admission: EM | Admit: 2022-05-11 | Discharge: 2022-05-11 | Disposition: A | Discharge status: Home / Self Care | Payer: Self-pay | Attending: Emergency Medicine | Admitting: Emergency Medicine

## 2022-05-11 ENCOUNTER — Emergency Department (HOSPITAL_BASED_OUTPATIENT_CLINIC_OR_DEPARTMENT_OTHER): Payer: Self-pay | Admitting: Radiology

## 2022-05-11 ENCOUNTER — Other Ambulatory Visit: Payer: Self-pay

## 2022-05-11 ENCOUNTER — Encounter (HOSPITAL_BASED_OUTPATIENT_CLINIC_OR_DEPARTMENT_OTHER): Payer: Self-pay

## 2022-05-11 DIAGNOSIS — R002 Palpitations: Secondary | ICD-10-CM

## 2022-05-11 DIAGNOSIS — I4581 Long QT syndrome: Secondary | ICD-10-CM | POA: Insufficient documentation

## 2022-05-11 DIAGNOSIS — R000 Tachycardia, unspecified: Secondary | ICD-10-CM | POA: Insufficient documentation

## 2022-05-11 DIAGNOSIS — R9431 Abnormal electrocardiogram [ECG] [EKG]: Secondary | ICD-10-CM

## 2022-05-11 LAB — CBC
HCT: 39.5 % (ref 36.0–46.0)
Hemoglobin: 13 g/dL (ref 12.0–15.0)
MCH: 28.9 pg (ref 26.0–34.0)
MCHC: 32.9 g/dL (ref 30.0–36.0)
MCV: 87.8 fL (ref 80.0–100.0)
Platelets: 421 10*3/uL — ABNORMAL HIGH (ref 150–400)
RBC: 4.5 MIL/uL (ref 3.87–5.11)
RDW: 13.7 % (ref 11.5–15.5)
WBC: 8.9 10*3/uL (ref 4.0–10.5)
nRBC: 0 % (ref 0.0–0.2)

## 2022-05-11 LAB — BASIC METABOLIC PANEL
Anion gap: 10 (ref 5–15)
BUN: 6 mg/dL (ref 6–20)
CO2: 23 mmol/L (ref 22–32)
Calcium: 10.1 mg/dL (ref 8.9–10.3)
Chloride: 102 mmol/L (ref 98–111)
Creatinine, Ser: 0.87 mg/dL (ref 0.44–1.00)
GFR, Estimated: >60 mL/min (ref >60)
Glucose, Bld: 114 mg/dL — ABNORMAL HIGH (ref 70–99)
Potassium: 3.4 mmol/L — ABNORMAL LOW (ref 3.5–5.1)
Sodium: 135 mmol/L (ref 135–145)

## 2022-05-11 LAB — MAGNESIUM: Magnesium: 2 mg/dL (ref 1.7–2.4)

## 2022-05-11 NOTE — Discharge Instructions (Signed)
Palpitations-please follow-up with your cardiologist for further evaluation, I would recommend you wear the heart monitor this can help capture episodes of heart palpitations.  Also want to see them in regards to your prolonged QT. Anxiety-I suspect you have some situational anxiety, please follow-up with [facility] behavioral health for further evaluation.  Come back to the emergency department if you develop chest pain, shortness of breath, severe abdominal pain, uncontrolled nausea, vomiting, diarrhea.

## 2022-05-11 NOTE — ED Triage Notes (Signed)
Pt states she has been having heart palpitations x1 week. No other complaints at this time.

## 2022-05-11 NOTE — ED Provider Notes (Signed)
[facility] EMERGENCY DEPT Provider Note   CSN: [id number] Arrival date & time: 05/11/22  0104     History  Chief Complaint  Patient presents with   Palpitations    Deborah Grant is a 42 y.o. female.  HPI   Patient with medical history including anxiety, heart palpitations presents with complaints of heart palpitations.  Patient states she has been dealing with heart palpitations patient's for last couple of weeks, states felt slightly worse yesterday, she states heart palpitations come on randomly, describes the feeling as her heart  skipping a beat, this will last a few minutes then resolve on its own.  She does not have associated chest pain, shortness of breath, pleuritic chest pain, she does not become diaphoretic lightheaded dizziness no nausea or vomiting.  She has no cardiac history no history of PEs or DVTs she is currently not on oral birth control, there is been no recent surgeries, no long immobilization, she denies any worsening leg swelling.  She denies any illicit drug use she denies excessive caffeine consumption, she does note that she has been dealing with situational anxiety, she is not being treated for this.   I have reviewed her chart she has been seen by cardiology, they recommend heart monitor for further evaluation unfortunately patient never followed through with this.    Home Medications Prior to Admission medications   Medication Sig Start Date End Date Taking? Authorizing Provider  triamcinolone cream (KENALOG) 0.1 % SMARTSIG:sparingly Topical Twice Daily 06/29/21   [provider]      Allergies    Patient has no known allergies.    Review of Systems   Review of Systems  Constitutional:  Negative for chills and fever.  Respiratory:  Negative for shortness of breath.   Cardiovascular:  Positive for palpitations. Negative for chest pain.  Gastrointestinal:  Negative for abdominal pain.  Neurological:  Negative for headaches.     Physical Exam Updated Vital Signs BP (!) 140/95 (BP Location: Right Arm)   Pulse 97   Temp 98.5 F (36.9 C) (Oral)   Resp 18   Ht 5\' 6"  (1.676 m)   Wt 78.4 kg   SpO2 98%   BMI 27.90 kg/m  Physical Exam Vitals and nursing note reviewed.  Constitutional:      General: She is not in acute distress.    Appearance: She is not ill-appearing.  HENT:     Head: Normocephalic and atraumatic.     Nose: No congestion.  Eyes:     Conjunctiva/sclera: Conjunctivae normal.  Cardiovascular:     Rate and Rhythm: Normal rate and regular rhythm.     Pulses: Normal pulses.     Heart sounds: No murmur heard.    No friction rub. No gallop.  Pulmonary:     Effort: No respiratory distress.     Breath sounds: No wheezing, rhonchi or rales.  Musculoskeletal:     Right lower leg: No edema.     Left lower leg: No edema.     Comments: No unilateral leg swelling, no calf tenderness there is no palpable cords.  Skin:    General: Skin is warm and dry.  Neurological:     Mental Status: She is alert.  Psychiatric:        Mood and Affect: Mood normal.     ED Results / Procedures / Treatments   Labs (all labs ordered are listed, but only abnormal results are displayed) Labs Reviewed  BASIC METABOLIC PANEL - Abnormal;  Notable for the following components:      Result Value   Potassium 3.4 (*)    Glucose, Bld 114 (*)    All other components within normal limits  CBC - Abnormal; Notable for the following components:   Platelets 421 (*)    All other components within normal limits  MAGNESIUM  PREGNANCY, URINE    EKG  Radiology DG Chest 2 View  Result Date: 05/11/2022 CLINICAL DATA:  Chest pain, palpitations. EXAM: CHEST - 2 VIEW COMPARISON:  None Available. FINDINGS: The heart size and mediastinal contours are within normal limits. Both lungs are clear. No acute osseous abnormality. IMPRESSION: No active cardiopulmonary disease. Electronically Signed   By: Thornell Sartorius M.D.   On:  05/11/2022 02:31    Procedures Procedures    Medications Ordered in ED Medications - No data to display  ED Course/ Medical Decision Making/ A&P                           Medical Decision Making Amount and/or Complexity of Data Reviewed Labs: ordered. Radiology: ordered.   This patient presents to the ED for concern of palpitations, this involves an extensive number of treatment options, and is a complaint that carries with it a high risk of complications and morbidity.  The differential diagnosis includes arrhythmias, electrolyte derailment, ACS, PE    Additional history obtained:  Additional history obtained from N/A External records from outside source obtained and reviewed including cardiology notes   Co morbidities that complicate the patient evaluation  N/A  Social Determinants of Health:   no PCP    Lab Tests:  I Ordered, and personally interpreted labs.  The pertinent results include: CBC is unremarkable, BMP shows potassium 3.4, glucose 114 mag 2   Imaging Studies ordered:  I ordered imaging studies including chest x-ray I independently visualized and interpreted imaging which showed negative I agree with the radiologist interpretation   Cardiac Monitoring:  The patient was maintained on a cardiac monitor.  I personally viewed and interpreted the cardiac monitored which showed an underlying rhythm of: EKG without signs of ischemia does show prolonged QT   Medicines ordered and prescription drug management:  I ordered medication including N/A I have reviewed the patients home medicines and have made adjustments as needed  Critical Interventions:  N/A   Reevaluation:  Presents with heart palpitations, triage obtain basic lab work and imaging which I personally reviewed they are essentially unremarkable, other than the prolonged QT, will add on magnesium to rule out electrolyte derailment  Patient is reassessed resting comfortably she is  agreement discharge at this time   Consultations Obtained:  N/A   Test Considered:  N/A    Rule out I have low suspicion for ACS as history is atypical, patient has no cardiac history, EKG was sinus rhythm without signs of ischemia Low suspicion for PE as patient denies pleuritic chest pain, shortness of breath, patient denies leg pain, no pedal edema noted on exam, patient.  Patient did have 1 recorded episode of tachycardia, this tachycardia has not been sustained, on each -re-evaluation her heart rate has been in the mid 70s, her presentation is very atypical for PE,  no recent surgeries no long immobilization she is not oral birth control, she is not endorsing any pleuritic chest pain or shortness of breath, she is also nontachypneic nonhypoxic.  She does appear to be slightly anxious possibly she was anxious on arrival because  her heart rate to be elevated.  Low suspicion for AAA or aortic dissection as history is atypical, patient has low risk factors.  Low suspicion for systemic infection as patient is nontoxic-appearing, vital signs reassuring, no obvious source infection noted on exam.     Dispostion and problem list  After consideration of the diagnostic results and the patients response to treatment, I feel that the patent would benefit from discharge.  Palpitations-unclear etiology, suspect this is likely multifactorial, she could be experiencing some episodes of SVT or PVCs, but I also suspect there is an underlying component of situational anxiety, have her follow-up with PCP for further evaluation anxiety, will also have her follow-up with cardiology as I feel she would benefit from heart monitoring as well as further evaluation of her prolonged QT.            Final Clinical Impression(s) / ED Diagnoses Final diagnoses:  Palpitations  Prolonged QT interval    Rx / DC Orders ED Discharge Orders     None         Marcello Fennel, PA-C 05/11/22  0456    Merryl Hacker, MD 05/12/22 [phone]

## 2022-05-26 ENCOUNTER — Encounter: Payer: Self-pay | Admitting: *Deleted

## 2022-05-26 ENCOUNTER — Encounter: Payer: Self-pay | Admitting: Internal Medicine

## 2022-05-26 ENCOUNTER — Ambulatory Visit: Payer: Medicaid Other | Attending: Internal Medicine | Admitting: Internal Medicine

## 2022-05-26 VITALS — BP 142/94 | HR 105 | Ht 66.0 in | Wt 184.2 lb

## 2022-05-26 DIAGNOSIS — R002 Palpitations: Secondary | ICD-10-CM

## 2022-05-26 NOTE — Patient Instructions (Signed)
Medication Instructions:  Your physician recommends that you continue on your current medications as directed. Please refer to the Current Medication list given to you today.  *If you need a refill on your cardiac medications before your next appointment, please call your pharmacy*   Lab Work: NONE If you have labs (blood work) drawn today and your tests are completely normal, you will receive your results only by: [place] (if you have MyChart) OR A paper copy in the mail If you have any lab test that is abnormal or we need to change your treatment, we will call you to review the results.   Testing/Procedures: NONE   Follow-Up: At [facility], you and your health needs are our priority.  As part of our continuing mission to provide you with exceptional heart care, we have created designated Provider Care Teams.  These Care Teams include your primary Cardiologist (physician) and Advanced Practice Providers (APPs -  Physician Assistants and Nurse Practitioners) who all work together to provide you with the care you need, when you need it.  We recommend signing up for the patient portal called "MyChart".  Sign up information is provided on this After Visit Summary.  MyChart is used to connect with patients for Virtual Visits (Telemedicine).  Patients are able to view lab/test results, encounter notes, upcoming appointments, etc.  Non-urgent messages can be sent to your provider as well.   To learn more about what you can do with MyChart, go to [email or web address].    Your next appointment:   As Needed   Provider:   Janina Mayo, MD

## 2022-05-26 NOTE — Progress Notes (Signed)
Cardiology Office Note:    Date:  05/26/2022   ID:  Darrell Jewel, DOB November 12, 1980, MRN [id number]  PCP:  Everardo Beals, NP   [place] Providers Cardiologist:  Janina Mayo, MD     Referring MD: Everardo Beals, NP   No chief complaint on file. Palpitations  History of Present Illness:    Deborah Grant is a 42 y.o. female with no pmhx, referral for palpitations. She denies dizziness, syncope, caffeine intake, etoh, family hx of SCD. She has no known structural heart dx. She can get LH. She presented to the ED. EKG showed NSR. Noted TSH was normal February. She drinks tea but cut back on this. The palpitations have continue. She has heart fluttering every day. She's on not any medications  Interim Hx 05/26/2022 Deborah Grant returns today to review her cardiac monitor.  She had normal rhythm  Current Medications: No current outpatient medications on file prior to visit.   No current facility-administered medications on file prior to visit.    Allergies:   Patient has no known allergies.   Social History   Socioeconomic History   Marital status: Single    Spouse name: Not on file   Number of children: Not on file   Years of education: Not on file   Highest education level: Not on file  Occupational History   Not on file  Tobacco Use   Smoking status: Never    Passive exposure: Never   Smokeless tobacco: Never  Vaping Use   Vaping Use: Never used  Substance and Sexual Activity   Alcohol use: No   Drug use: No   Sexual activity: Not Currently    Birth control/protection: Abstinence  Other Topics Concern   Not on file  Social History Narrative   Not on file   Social Determinants of Health   Financial Resource Strain: Not on file  Food Insecurity: Not on file  Transportation Needs: Not on file  Physical Activity: Not on file  Stress: Not on file  Social Connections: Not on file     Family History: No family hx of heart disease  ROS:    Please see the history of present illness.     All other systems reviewed and are negative.  EKGs/Labs/Other Studies Reviewed:    The following studies were reviewed today:   EKG:  EKG is  ordered today.  The ekg ordered today demonstrates   11/18/2021-Sinus tachycardia HR 108 bpm  Recent Labs: 05/11/2022: BUN 6; Creatinine, Ser 0.87; Hemoglobin 13.0; Magnesium 2.0; Platelets 421; Potassium 3.4; Sodium 135   Recent Lipid Panel No results found for: "CHOL", "TRIG", "HDL", "CHOLHDL", "VLDL", "LDLCALC", "LDLDIRECT"   Risk Assessment/Calculations:           Physical Exam:    VS:  BP (!) 142/94   Pulse (!) 105   Ht 5\' 6"  (1.676 m)   Wt 184 lb 3.2 oz (83.6 kg)   SpO2 98%   BMI 29.73 kg/m     Wt Readings from Last 3 Encounters:  05/26/22 184 lb 3.2 oz (83.6 kg)  05/11/22 172 lb 13.5 oz (78.4 kg)  11/18/21 172 lb 12.8 oz (78.4 kg)     GEN:  Well nourished, well developed in no acute distress HEENT: Normal NECK: No JVD LYMPHATICS: No lymphadenopathy CARDIAC: RRR, no murmurs, rubs, gallops RESPIRATORY:  Clear to auscultation without rales, wheezing or rhonchi  ABDOMEN: Soft, non-tender, non-distended MUSCULOSKELETAL:  No edema; No deformity  SKIN: Warm and  dry NEUROLOGIC:  Alert and oriented x 3 PSYCHIATRIC:  Normal affect   ASSESSMENT:    Palpitations: her cardiac monitor showed normal rhythm. No NSVT or VT. QT noted to be prolonged on one ecg, typically normal. Recommended hydration with electrolytes. Stress relief. She can continue to exercise  PLAN:    In order of problems listed above:  No further cardiac w/u or surveillance        Medication Adjustments/Labs and Tests Ordered: Current medicines are reviewed at length with the patient today.  Concerns regarding medicines are outlined above.  No orders of the defined types were placed in this encounter.  No orders of the defined types were placed in this encounter.   Patient Instructions  Medication  Instructions:  Your physician recommends that you continue on your current medications as directed. Please refer to the Current Medication list given to you today.  *If you need a refill on your cardiac medications before your next appointment, please call your pharmacy*   Lab Work: NONE If you have labs (blood work) drawn today and your tests are completely normal, you will receive your results only by: [place] (if you have MyChart) OR A paper copy in the mail If you have any lab test that is abnormal or we need to change your treatment, we will call you to review the results.   Testing/Procedures: NONE   Follow-Up: At [facility], you and your health needs are our priority.  As part of our continuing mission to provide you with exceptional heart care, we have created designated Provider Care Teams.  These Care Teams include your primary Cardiologist (physician) and Advanced Practice Providers (APPs -  Physician Assistants and Nurse Practitioners) who all work together to provide you with the care you need, when you need it.  We recommend signing up for the patient portal called "MyChart".  Sign up information is provided on this After Visit Summary.  MyChart is used to connect with patients for Virtual Visits (Telemedicine).  Patients are able to view lab/test results, encounter notes, upcoming appointments, etc.  Non-urgent messages can be sent to your provider as well.   To learn more about what you can do with MyChart, go to [email or web address].    Your next appointment:   As Needed   Provider:   Janina Mayo, MD      Signed, Janina Mayo, MD  05/26/2022 9:28 AM    [place]

## 2022-05-29 ENCOUNTER — Telehealth: Payer: Self-pay | Admitting: Licensed Clinical Social Worker

## 2022-05-29 NOTE — Progress Notes (Signed)
  Heart and Vascular Care Navigation  05/29/2022  Deborah Grant July 30, 1980 [id number]  Reason for Referral: uninsured Patient is participating in a Managed Medicaid Plan:No, family planning only  Engaged with patient by telephone for initial visit for Heart and Vascular Care Coordination.                                                                                                   Assessment:               LCSW called pt, left voicemail and then received a call back from pt this morning. Introduced self, role, reason for for call. Pt confirmed home address, no current insurance or PCP. Her emergency contact remains her mother Bryson Corona. She is only working part time and applied for Medicaid years ago- she didn't feel the need to reapply as she previously had benefits through her employer at the time. Current job does not have benefits. Pt declines to provide further information about her household, interested in [employer]  and is okay with me sending it to her current address. Encouraged her to reapply for Medicaid and CAFA for assistance with previous bills. No current medications prescribed, discussed [place] should she be prescribed any medications and need assistance in future with cost.   No additional questions/concerns at this time.                    HRT/VAS Care Coordination     Patients Home Cardiology Office [place] Team Social Worker   Social Worker Name: Westley Hummer, [place], [place]   Living arrangements for the past 2 months Single Family Home   Lives with: Other (Comment)  pt declined   Patient Current Insurance Coverage Self-Pay   Patient Has Concern With Paying Medical Bills Yes   Patient Concerns With Medical Bills no coverage- lost employment that had benefits   Medical Bill Referrals: CAFA, Medicaid expansion flyer moving forward   Does Patient Have Prescription Coverage? No  not currently on any medications        Social History:                                                                             SDOH Screenings   Tobacco Use: Low Risk  (05/26/2022)    SDOH Interventions: Financial Resources:  DSS for financial assistance and Financial Counseling for [employer] Program     Follow-up plan:   Pt mailed a copy of the Medicaid expansion flyer, my card, and [employer]  application. I remain available should pt have any additional questions.

## 2022-10-03 ENCOUNTER — Ambulatory Visit (HOSPITAL_COMMUNITY)
Admission: RE | Admit: 2022-10-03 | Discharge: 2022-10-03 | Disposition: A | Discharge status: Home / Self Care | Payer: Medicaid Other | Source: Ambulatory Visit | Attending: Orthopedic Surgery | Admitting: Orthopedic Surgery

## 2022-10-03 ENCOUNTER — Encounter (HOSPITAL_COMMUNITY): Payer: Self-pay

## 2022-10-03 VITALS — BP 122/87 | HR 90 | Temp 98.1°F | Resp 18

## 2022-10-03 DIAGNOSIS — R197 Diarrhea, unspecified: Secondary | ICD-10-CM

## 2022-10-03 NOTE — Discharge Instructions (Signed)
Please continue with oral hydration and bland diet.  You may use sports drinks/electrolyte replacement drinks to help replenish electrolytes.  Return to the ER for any fevers, abdominal pain, worsening symptoms or any urgent changes in your health

## 2022-10-03 NOTE — ED Provider Notes (Addendum)
[facility]    CSN: [id number] Arrival date & time: 10/03/22  1458      History   Chief Complaint Chief Complaint  Patient presents with   Diarrhea    HPI Deborah Grant is a 42 y.o. female.  Presents to the emergency department for evaluation of diarrhea.  She has had 5 days of 5-6 episodes of diarrhea daily.  Diarrhea has been watery.  She denies any blood in the stool, fevers, abdominal pain, chills or bodyaches.  No nausea or vomiting.  She has been drinking lots of fluids and maintaining normal amount of p.o. intake with no dizziness, lightheadedness.  No signs of dehydration.  She did has any travel outside the country.  No recent antibiotic use.  No abdominal pain or cramping  HPI  History reviewed. No pertinent past medical history.  There are no problems to display for this patient.   History reviewed. No pertinent surgical history.  OB History     Gravida  2   Para      Term      Preterm      AB      Living  2      SAB      IAB      Ectopic      Multiple      Live Births  2            Home Medications    Prior to Admission medications   Not on File    Family History History reviewed. No pertinent family history.  Social History Social History   Tobacco Use   Smoking status: Never    Passive exposure: Never   Smokeless tobacco: Never  Vaping Use   Vaping Use: Never used  Substance Use Topics   Alcohol use: No   Drug use: No     Allergies   Patient has no known allergies.   Review of Systems Review of Systems   Physical Exam Triage Vital Signs ED Triage Vitals [10/03/22 1613]  Enc Vitals Group     BP 122/87     Pulse Rate 90     Resp 18     Temp 98.1 F (36.7 C)     Temp Source Oral     SpO2 98 %     Weight      Height      Head Circumference      Peak Flow      Pain Score      Pain Loc      Pain Edu?      Excl. in GC?    No data found.  Updated Vital Signs BP 122/87 (BP Location: Left  Arm)   Pulse 90   Temp 98.1 F (36.7 C) (Oral)   Resp 18   LMP 09/26/2022   SpO2 98%   Visual Acuity Right Eye Distance:   Left Eye Distance:   Bilateral Distance:    Right Eye Near:   Left Eye Near:    Bilateral Near:     Physical Exam Constitutional:      Appearance: She is well-developed.  HENT:     Head: Normocephalic and atraumatic.  Eyes:     Conjunctiva/sclera: Conjunctivae normal.  Cardiovascular:     Rate and Rhythm: Normal rate.  Pulmonary:     Effort: Pulmonary effort is normal. No respiratory distress.  Abdominal:     General: Abdomen is flat. Bowel sounds are normal. There is  no distension.     Palpations: Abdomen is soft. There is no mass.     Tenderness: There is no abdominal tenderness. There is no right CVA tenderness, left CVA tenderness, guarding or rebound.     Hernia: No hernia is present.  Musculoskeletal:        General: Normal range of motion.     Cervical back: Normal range of motion.  Skin:    General: Skin is warm.     Findings: No rash.  Neurological:     Mental Status: She is alert and oriented to person, place, and time.  Psychiatric:        Behavior: Behavior normal.        Thought Content: Thought content normal.      UC Treatments / Results  Labs (all labs ordered are listed, but only abnormal results are displayed) Labs Reviewed  STOOL CULTURE    EKG   Radiology No results found.  Procedures Procedures (including critical care time)  Medications Ordered in UC Medications - No data to display  Initial Impression / Assessment and Plan / UC Course  I have reviewed the triage vital signs and the nursing notes.  Pertinent labs & imaging results that were available during my care of the patient were reviewed by me and considered in my medical decision making (see chart for details).     42 year old with watery diarrhea over the last 5 days.  Diarrhea has been steady, she denies any dehydration.  She has no signs of  dehydration on exam.  Tolerating p.o. fluids well.  Her vital signs are stable, nontachycardic with normal blood pressure.  I have ordered a stool culture.  Stool culture obtained today.  Patient does not appear to have any type of acute abdomen, infectious diarrhea requiring antibiotics or any significant signs of dehydration.  Patient will continue to drink lots of fluids, encourage electrolyte drinks, continue with bland diet.  Patient understands signs symptoms return to the ER for. Final Clinical Impressions(s) / UC Diagnoses   Final diagnoses:  Diarrhea, unspecified type     Discharge Instructions      Please continue with oral hydration and bland diet.  You may use sports drinks/electrolyte replacement drinks to help replenish electrolytes.  Return to the ER for any fevers, abdominal pain, worsening symptoms or any urgent changes in your health   ED Prescriptions   None    PDMP not reviewed this encounter.   Evon Slack, PA-C 10/03/22 1645    Evon Slack, [place] 10/03/22 1646

## 2022-10-03 NOTE — ED Triage Notes (Signed)
Pt presents with 5 day history of loose stools. Denies any other symptoms at this time.

## 2023-04-20 ENCOUNTER — Encounter: Payer: Self-pay | Admitting: Urgent Care

## 2023-04-20 ENCOUNTER — Ambulatory Visit
Admission: EM | Admit: 2023-04-20 | Discharge: 2023-04-20 | Disposition: A | Discharge status: Home / Self Care | Payer: Medicaid Other | Attending: Internal Medicine | Admitting: Internal Medicine

## 2023-04-20 DIAGNOSIS — R350 Frequency of micturition: Secondary | ICD-10-CM

## 2023-04-20 DIAGNOSIS — N3001 Acute cystitis with hematuria: Secondary | ICD-10-CM

## 2023-04-20 LAB — POCT URINALYSIS DIP (MANUAL ENTRY)
Bilirubin, UA: NEGATIVE
Glucose, UA: NEGATIVE mg/dL
Nitrite, UA: POSITIVE — AB
Protein Ur, POC: >=300 mg/dL — AB
Spec Grav, UA: >=1.03 — AB (ref 1.010–1.025)
Urobilinogen, UA: 0.2 U/dL
pH, UA: 6 (ref 5.0–8.0)

## 2023-04-20 LAB — POCT URINE PREGNANCY: Preg Test, Ur: NEGATIVE

## 2023-04-20 MED ORDER — CEPHALEXIN 500 MG PO CAPS: 500.0000 mg | ORAL_CAPSULE | Freq: Two times a day (BID) | ORAL | 0 refills | Status: AC

## 2023-04-20 NOTE — ED Triage Notes (Signed)
Pt reports burning when urinating, cloudy urine x 2 days; blood when she wipes started today.

## 2023-04-20 NOTE — Discharge Instructions (Signed)
Please start cephalexin to address an urinary tract infection. Make sure you hydrate very well with plain water and a quantity of 64 ounces of water a day.  Please limit drinks that are considered urinary irritants such as soda, sweet tea, coffee, energy drinks, alcohol.  These can worsen your urinary and genital symptoms but also be the source of them.  I will let you know about your urine culture results through MyChart to see if we need to prescribe or change your antibiotics based off of those results.

## 2023-04-20 NOTE — ED Provider Notes (Signed)
[facility]  Note:  This document was prepared using Conservation officer, historic buildings and may include unintentional dictation errors.  MRN: [id number] DOB: 25-Oct-1980  Subjective:   Deborah Grant is a 42 y.o. female presenting for 2-day history of cloudy urine, urinary frequency and urgency, hematuria, dysuria.   No current facility-administered medications for this encounter. No current outpatient medications on file.   No Known Allergies  History reviewed. No pertinent past medical history.   History reviewed. No pertinent surgical history.  History reviewed. No pertinent family history.  Social History   Tobacco Use   Smoking status: Never    Passive exposure: Never   Smokeless tobacco: Never  Vaping Use   Vaping status: Never Used  Substance Use Topics   Alcohol use: No   Drug use: Never    ROS   Objective:   Vitals: BP (!) 157/102 (BP Location: Right Arm)   Pulse 97   Temp 98.5 F (36.9 C) (Oral)   Resp 16   LMP  (Within Weeks) Comment: 1 week  SpO2 95%   Physical Exam Constitutional:      General: She is not in acute distress.    Appearance: Normal appearance. She is well-developed. She is not ill-appearing, toxic-appearing or diaphoretic.  HENT:     Head: Normocephalic and atraumatic.     Nose: Nose normal.     Mouth/Throat:     Mouth: Mucous membranes are moist.     Pharynx: Oropharynx is clear.  Eyes:     General: No scleral icterus.       Right eye: No discharge.        Left eye: No discharge.     Extraocular Movements: Extraocular movements intact.     Conjunctiva/sclera: Conjunctivae normal.  Cardiovascular:     Rate and Rhythm: Normal rate.  Pulmonary:     Effort: Pulmonary effort is normal.  Abdominal:     General: Bowel sounds are normal. There is no distension.     Palpations: Abdomen is soft. There is no mass.     Tenderness: There is no abdominal tenderness. There is no right CVA tenderness, left CVA  tenderness, guarding or rebound.  Skin:    General: Skin is warm and dry.  Neurological:     General: No focal deficit present.     Mental Status: She is alert and oriented to person, place, and time.  Psychiatric:        Mood and Affect: Mood normal.        Behavior: Behavior normal.        Thought Content: Thought content normal.        Judgment: Judgment normal.     Results for orders placed or performed during the hospital encounter of 04/20/23 (from the past 24 hours)  POCT urinalysis dipstick     Status: Abnormal   Collection Time: 04/20/23  7:20 PM  Result Value Ref Range   Color, UA straw (A) yellow   Clarity, UA cloudy (A) clear   Glucose, UA negative negative mg/dL   Bilirubin, UA negative negative   Ketones, POC UA trace (5) (A) negative mg/dL   Spec Grav, UA >=7.829 (A) 1.010 - 1.025   Blood, UA large (A) negative   pH, UA 6.0 5.0 - 8.0   Protein Ur, POC >=300 (A) negative mg/dL   Urobilinogen, UA 0.2 0.2 or 1.0 E.U./dL   Nitrite, UA Positive (A) Negative   Leukocytes, UA Small (1+) (A)  Negative  POCT urine pregnancy     Status: None   Collection Time: 04/20/23  7:20 PM  Result Value Ref Range   Preg Test, Ur Negative Negative    Assessment and Plan :   PDMP not reviewed this encounter.  1. Acute cystitis with hematuria   2. Urinary frequency    Start Keflex to cover for acute cystitis, urine culture pending.  Recommended aggressive hydration, limiting urinary irritants. Counseled patient on potential for adverse effects with medications prescribed/recommended today, ER and return-to-clinic precautions discussed, patient verbalized understanding.    Wallis Bamberg, [place] 04/20/23 Ernestina [place]

## 2023-04-21 LAB — URINE CULTURE
